# Patient Record
Sex: Female | Born: 1970
Health system: Southern US, Community
[De-identification: ages and names within clinical notes are randomized; demographics above are authoritative.]

## PROBLEM LIST (undated history)

## (undated) DIAGNOSIS — Z91018 Allergy to other foods: Secondary | ICD-10-CM

## (undated) DIAGNOSIS — E119 Type 2 diabetes mellitus without complications: Secondary | ICD-10-CM

## (undated) DIAGNOSIS — H409 Unspecified glaucoma: Secondary | ICD-10-CM

## (undated) DIAGNOSIS — J45909 Unspecified asthma, uncomplicated: Secondary | ICD-10-CM

## (undated) DIAGNOSIS — L309 Dermatitis, unspecified: Secondary | ICD-10-CM

## (undated) HISTORY — PX: TYMPANOSTOMY TUBE PLACEMENT: SHX32

## (undated) HISTORY — PX: TOE SURGERY: SHX1073

## (undated) HISTORY — DX: Unspecified glaucoma: H40.9

## (undated) HISTORY — DX: Dermatitis, unspecified: L30.9

## (undated) HISTORY — DX: Allergy to other foods: Z91.018

## (undated) HISTORY — PX: GLAUCOMA SURGERY: SHX656

## (undated) HISTORY — DX: Type 2 diabetes mellitus without complications: E11.9

---

## 2014-04-13 ENCOUNTER — Encounter (HOSPITAL_COMMUNITY): Payer: Self-pay | Admitting: Emergency Medicine

## 2014-04-13 ENCOUNTER — Emergency Department (HOSPITAL_COMMUNITY)
Admission: EM | Admit: 2014-04-13 | Discharge: 2014-04-13 | Disposition: A | Discharge status: Home / Self Care | Payer: BC Managed Care – PPO | Attending: Emergency Medicine | Admitting: Emergency Medicine

## 2014-04-13 DIAGNOSIS — Z79899 Other long term (current) drug therapy: Secondary | ICD-10-CM | POA: Diagnosis not present

## 2014-04-13 DIAGNOSIS — Y9389 Activity, other specified: Secondary | ICD-10-CM | POA: Insufficient documentation

## 2014-04-13 DIAGNOSIS — S199XXA Unspecified injury of neck, initial encounter: Secondary | ICD-10-CM

## 2014-04-13 DIAGNOSIS — J45909 Unspecified asthma, uncomplicated: Secondary | ICD-10-CM | POA: Diagnosis not present

## 2014-04-13 DIAGNOSIS — T148XXA Other injury of unspecified body region, initial encounter: Secondary | ICD-10-CM

## 2014-04-13 DIAGNOSIS — Y92410 Unspecified street and highway as the place of occurrence of the external cause: Secondary | ICD-10-CM | POA: Insufficient documentation

## 2014-04-13 DIAGNOSIS — S0993XA Unspecified injury of face, initial encounter: Secondary | ICD-10-CM | POA: Diagnosis not present

## 2014-04-13 DIAGNOSIS — IMO0002 Reserved for concepts with insufficient information to code with codable children: Secondary | ICD-10-CM | POA: Diagnosis not present

## 2014-04-13 HISTORY — DX: Unspecified asthma, uncomplicated: J45.909

## 2014-04-13 MED ORDER — CYCLOBENZAPRINE HCL 10 MG PO TABS: 10.0000 mg | ORAL_TABLET | Freq: Three times a day (TID) | ORAL | Status: DC | PRN
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
## 2014-04-13 NOTE — ED Provider Notes (Signed)
Medical screening examination/treatment/procedure(s) were performed by non-physician practitioner and as supervising physician I was immediately available for consultation/collaboration.   EKG Interpretation None       Doug SouSam Yousra Ivens, MD 04/13/14 2256
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
## 2014-11-08 ENCOUNTER — Ambulatory Visit: Payer: Self-pay | Admitting: Emergency Medicine

## 2015-02-07 ENCOUNTER — Ambulatory Visit (INDEPENDENT_AMBULATORY_CARE_PROVIDER_SITE_OTHER): Payer: BLUE CROSS/BLUE SHIELD | Admitting: Family Medicine

## 2015-02-07 VITALS — BP 130/94 | HR 93 | Temp 98.6°F | Resp 18 | Ht 69.0 in | Wt 306.2 lb

## 2015-02-07 DIAGNOSIS — J209 Acute bronchitis, unspecified: Secondary | ICD-10-CM

## 2015-02-07 MED ORDER — PREDNISONE 20 MG PO TABS: ORAL_TABLET | ORAL | Status: DC

## 2015-02-07 MED ORDER — AZITHROMYCIN 250 MG PO TABS: ORAL_TABLET | ORAL | Status: DC

## 2015-02-07 NOTE — Progress Notes (Signed)
Patient ID: Megan Lynn, female   DOB: 09-13-1970, 44 y.o.   MRN: [id number]   This chart was scribed for Elvina Sidle, MD by [facility], medical scribe at [facility].The patient was seen in exam room 12 and the patient's care was started at 6:28 PM.  Patient ID: Megan Lynn MRN: [id number], DOB: 1971/04/02, 44 y.o. Date of Encounter: 02/07/2015  Primary Physician: No primary care provider on file.  Chief Complaint:  Chief Complaint  Patient presents with   Cough    x yesterday   Shortness of Breath    some wheezing   HPI:  Megan Lynn is a 44 y.o. female who presents to [facility] complaining of cough, sore throat, headache for one week. Productive cough, shortness of breath and wheezing began yesterday. Sore throat has improved but chest congestion worsened. She is producing green, yellow phlegm. Pt has a history of asthma and she is taking Nucala, she does have a rescue inhaler. Stomach flu a week before this began so she still has some nausea but no vomiting or diarrhea. She denies fever. 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 She recently accepted a family from [place].  Past Medical History  Diagnosis Date   Asthma     Home Meds: Prior to Admission medications   Medication Sig Start Date End Date Taking? Authorizing Provider  albuterol (PROVENTIL HFA;VENTOLIN HFA) 108 (90 BASE) MCG/ACT inhaler Inhale into the lungs every 6 (six) hours as needed for wheezing or shortness of breath.   Yes Historical Provider, MD  cetirizine (ZYRTEC ALLERGY) 10 MG tablet Take 10 mg by mouth daily.   Yes Historical Provider, MD  fluticasone (FLONASE) 50 MCG/ACT nasal spray Place 1 spray into both nostrils daily as needed for allergies.    Yes Historical Provider, MD  hydrOXYzine (ATARAX/VISTARIL) 10 MG tablet Take 10 mg by mouth 3 (three) times daily as needed for itching.   Yes Historical Provider, MD  Mepolizumab (NUCALA [place]) Inject into  the skin.   Yes Historical Provider, MD    Allergies:  Allergies  Allergen Reactions   Sulfa Antibiotics Shortness Of Breath and Rash    History   Social History   Marital Status: Single    Spouse Name: N/A   Number of Children: N/A   Years of Education: N/A   Occupational History   Not on file.   Social History Main Topics   Smoking status: Never Smoker    Smokeless tobacco: Not on file   Alcohol Use: Yes     Comment: occ   Drug Use: No   Sexual Activity: Yes    Birth Control/ Protection: None   Other Topics Concern   Not on file   Social History Narrative    Review of Systems: Constitutional: negative for chills, fever, night sweats, weight changes, or fatigue  HEENT: negative for vision changes, hearing loss, rhinorrhea, epistaxis. Positive for sinus pressure, congestion, and sore throat. Cardiovascular: negative for chest pain or palpitations Respiratory: negative for hemoptysis. Positive for wheezing, shortness of breath, or cough Abdominal: negative for abdominal pain, vomiting, diarrhea, or constipation Positive for nausea. Dermatological: negative for rash Neurologic: negative for headache, dizziness, or syncope All other systems reviewed and are otherwise negative with the exception to those above and in the HPI.  Physical Exam: Blood pressure 130/94, pulse 93, temperature 98.6 F (37 C), temperature source Oral, resp. rate 18, height  (1.753 m), weight 306 lb  3.2 oz (138.891 kg), SpO2 97 %.Body mass index is 45.2 kg/(m^2). General: Well developed, well nourished, in no acute distress. Head: Normocephalic, atraumatic, eyes without discharge, sclera non-icteric, nares are without discharge. Bilateral auditory canals clear, TM's are without perforation, pearly grey and translucent with reflective cone of light bilaterally. Oral cavity moist, posterior pharynx without exudate, erythema, peritonsillar abscess, or post nasal drip.  Neck: Supple. No  thyromegaly. Full ROM. No lymphadenopathy. Lungs: Clear bilaterally to auscultation. Breathing is unlabored. Expiratory high pitch squeaks. No accessory muscles used. No tachypnea. Heart: RRR with S1 S2. No murmurs, rubs, or gallops appreciated. Abdomen: Soft, non-tender, non-distended with normoactive bowel sounds. No hepatomegaly. No rebound/guarding. No obvious abdominal masses. Msk:  Strength and tone normal for age. Extremities/Skin: Warm and dry. No clubbing or cyanosis. No edema. No rashes or suspicious lesions. Neuro: Alert and oriented X 3. Moves all extremities spontaneously. Gait is normal. CNII-XII grossly in tact. Psych:  Responds to questions appropriately with a normal affect.   Labs:  ASSESSMENT AND PLAN:  44 y.o. year old female with   This chart was scribed in my presence and reviewed by me personally.    ICD-9-CM ICD-10-CM   1. Acute bronchitis, unspecified organism 466.0 J20.9 predniSONE (DELTASONE) 20 MG tablet     azithromycin (ZITHROMAX) 250 MG tablet     Signed, Elvina Sidle, MD  Signed, Elvina Sidle, MD 02/07/2015 6:28 PM

## 2015-05-08 DIAGNOSIS — J4540 Moderate persistent asthma, uncomplicated: Secondary | ICD-10-CM

## 2015-05-08 DIAGNOSIS — J309 Allergic rhinitis, unspecified: Secondary | ICD-10-CM | POA: Insufficient documentation

## 2015-05-08 DIAGNOSIS — L309 Dermatitis, unspecified: Secondary | ICD-10-CM | POA: Insufficient documentation

## 2015-05-08 DIAGNOSIS — J4550 Severe persistent asthma, uncomplicated: Secondary | ICD-10-CM | POA: Insufficient documentation

## 2015-05-08 DIAGNOSIS — Z91018 Allergy to other foods: Secondary | ICD-10-CM | POA: Insufficient documentation

## 2015-05-09 ENCOUNTER — Other Ambulatory Visit: Payer: Self-pay | Admitting: *Deleted

## 2015-05-09 MED ORDER — MEPOLIZUMAB 100 MG ~~LOC~~ SOLR
100.0000 mg | SUBCUTANEOUS | Status: DC
  Administered 2015-06-09 – 2016-03-08 (×8): 100 mg via SUBCUTANEOUS

## 2015-06-09 ENCOUNTER — Ambulatory Visit (INDEPENDENT_AMBULATORY_CARE_PROVIDER_SITE_OTHER): Payer: BLUE CROSS/BLUE SHIELD | Admitting: Neurology

## 2015-06-09 DIAGNOSIS — J4550 Severe persistent asthma, uncomplicated: Secondary | ICD-10-CM

## 2015-06-09 DIAGNOSIS — J45901 Unspecified asthma with (acute) exacerbation: Secondary | ICD-10-CM

## 2015-06-16 ENCOUNTER — Other Ambulatory Visit: Payer: Self-pay | Admitting: *Deleted

## 2015-06-16 MED ORDER — ALBUTEROL SULFATE HFA 108 (90 BASE) MCG/ACT IN AERS: 2.0000 | INHALATION_SPRAY | RESPIRATORY_TRACT | Status: DC | PRN

## 2015-06-30 ENCOUNTER — Other Ambulatory Visit: Payer: Self-pay | Admitting: Allergy and Immunology

## 2015-06-30 ENCOUNTER — Telehealth: Payer: Self-pay

## 2015-06-30 MED ORDER — PREDNISONE 10 MG PO TABS: 40.0000 mg | ORAL_TABLET | Freq: Once | ORAL | Status: DC

## 2015-06-30 NOTE — Telephone Encounter (Signed)
Patient is notified of Prednisone RX sent to pharmacy and to see Dr. Lucie LeatherKozlow tomorrow 07/01/15 at 8:00 AM.  Patient voiced understood

## 2015-06-30 NOTE — Telephone Encounter (Signed)
Please have patient take 40mg  of prednisone now and see me in clinic at 800AM or can see Dr. Nunzio CobbsBobbitt today
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
## 2015-06-30 NOTE — Telephone Encounter (Signed)
PATIENT HAS HAD A RASH FOR A WEEK VERY ITCHY MAKING IT HARD TO SLEEP. SHE SAID HER SKIN FEELS LIKE AN ELEPHANT. HAS TRIED TRIAMCINOLONE, AND SOME ANTIBIOTICS.    PHARMACY IS [facility]  PATIENT OF DR. KOZLOW  PLEASE ADVISE THANKS

## 2015-06-30 NOTE — Telephone Encounter (Signed)
Tried to call patient.  No answer.  Left message for patient to call back to our office.

## 2015-07-01 ENCOUNTER — Ambulatory Visit (INDEPENDENT_AMBULATORY_CARE_PROVIDER_SITE_OTHER): Payer: BLUE CROSS/BLUE SHIELD | Admitting: Allergy and Immunology

## 2015-07-01 ENCOUNTER — Encounter: Payer: Self-pay | Admitting: Allergy and Immunology

## 2015-07-01 VITALS — BP 122/84 | HR 80 | Resp 20

## 2015-07-01 DIAGNOSIS — L209 Atopic dermatitis, unspecified: Secondary | ICD-10-CM | POA: Diagnosis not present

## 2015-07-01 DIAGNOSIS — J3089 Other allergic rhinitis: Secondary | ICD-10-CM | POA: Diagnosis not present

## 2015-07-01 DIAGNOSIS — Z91018 Allergy to other foods: Secondary | ICD-10-CM

## 2015-07-01 DIAGNOSIS — J4541 Moderate persistent asthma with (acute) exacerbation: Secondary | ICD-10-CM | POA: Diagnosis not present

## 2015-07-01 MED ORDER — FLUTICASONE FUROATE-VILANTEROL 200-25 MCG/INH IN AEPB: 1.0000 | INHALATION_SPRAY | Freq: Every day | RESPIRATORY_TRACT | Status: DC

## 2015-07-01 NOTE — Patient Instructions (Addendum)
  1. Prednisone 10mg  - four tablets today and tomorrow, then three tablets one time a day for 3 days, then two tablets one time per day for three days, then one tablet one time a day foe seven days  2. Continue topical therapy from Dr. Terri PiedraLupton  3. Check blood test results [place]  4. Breo 200 one inhalation one time per day. Add Qvar 80 two inhalations two times per day during 'flare up'  5. Continue flonase one or two sprays each nostril one time per day  6. Continue ProAir HFA, Epi-Pen, antihistamine if needed.  7. Continue NUCALA  8. Return at end of prednisone use or earlier if problem

## 2015-07-01 NOTE — Progress Notes (Signed)
[facility] [place] [place]  Follow-up Note  Refering Provider: No ref. provider found Primary Provider: Jerl MinaHEDRICK, JAMES, MD  Subjective:   Megan SellsSheila Dawnell Lynn is a 44 y.o. female who [facility] in re-evaluation of the following:  HPI Comments:  Megan HatchetSheila returns to this clinic noting that she's had problems with her asthma and her skin over the past weekend. She is developed wheezing and shortness of breath and had to use her bronchodilator multiple times this weekend. She is also developed a very red itchy dermatitis involving her arms and legs especially her feet and hands. She contacted me by telephone yesterday and we gave her 40 mg of prednisone and her rash is significantly improved as is her breathing. A lot of the swelling of her hands and redness of her hands has gone down as a result of taking prednisone. Concerning possible triggers, she has not had any new exposures, new medications, new supplements, and she is not had a fever or ugly mucus production. She did have nucala 3 weeks ago. However for the initial 2 weeks after nucala she had no problems.   Outpatient Encounter Prescriptions as of 07/01/2015  Medication Sig  . albuterol (PROAIR HFA) 108 (90 BASE) MCG/ACT inhaler Inhale 2 puffs into the lungs every 4 (four) hours as needed for wheezing or shortness of breath.  . cetirizine (ZYRTEC ALLERGY) 10 MG tablet Take 10 mg by mouth daily.  . diphenhydrAMINE (BENADRYL) 12.5 MG/5ML elixir Take by mouth as needed.  Marland Kitchen. EPINEPHrine (EPIPEN 2-PAK) 0.3 mg/0.3 mL IJ SOAJ injection Inject 0.3 mg into the muscle as needed (ALLERGIC REACTIONS).  . fluticasone (FLONASE) 50 MCG/ACT nasal spray 1 spray 2 (two) times daily.  . hydrOXYzine (ATARAX/VISTARIL) 25 MG tablet Take by mouth 2 (two) times daily.  . mometasone (ASMANEX 120 METERED DOSES) 220 MCG/INH inhaler Inhale into the lungs as needed. Prn  . [DISCONTINUED] albuterol  (PROVENTIL HFA;VENTOLIN HFA) 108 (90 BASE) MCG/ACT inhaler Inhale into the lungs every 6 (six) hours as needed for wheezing or shortness of breath.  . [DISCONTINUED] azithromycin (ZITHROMAX) 250 MG tablet 2-1-1-1-1 daily with food (Patient not taking: Reported on 06/30/2015)  . [DISCONTINUED] cetirizine (ZYRTEC) 10 MG tablet Take 1 tablet by mouth.  . [DISCONTINUED] fluticasone (FLONASE) 50 MCG/ACT nasal spray Place 1 spray into both nostrils daily as needed for allergies.   . [DISCONTINUED] hydrOXYzine (ATARAX/VISTARIL) 10 MG tablet Take 10 mg by mouth 2 (two) times daily.   . [DISCONTINUED] hydrOXYzine (ATARAX/VISTARIL) 10 MG tablet Take 1 tablet by mouth 2 (two) times daily.  . [DISCONTINUED] Mometasone Furoate (ASMANEX HFA) 200 MCG/ACT AERO Inhale 2 puffs into the lungs daily.  . [DISCONTINUED] predniSONE (DELTASONE) 10 MG tablet Take 4 tablets (40 mg total) by mouth once. Take 40 mg today at one time. (Patient not taking: Reported on 07/01/2015)  . [DISCONTINUED] predniSONE (DELTASONE) 20 MG tablet Two daily with food (Patient not taking: Reported on 06/30/2015)  . [DISCONTINUED] ranitidine (ZANTAC) 150 MG tablet Take by mouth daily.   Facility-Administered Encounter Medications as of 07/01/2015  Medication  . Mepolizumab SOLR 100 mg    No orders of the defined types were placed in this encounter.    Past Medical History  Diagnosis Date  . Asthma     Past Surgical History  Procedure Laterality Date  . Glaucoma surgery    . Tympanostomy tube placement      Allergies  Allergen Reactions  .  Erythromycin Shortness Of Breath and Rash  . Shellfish Allergy Shortness Of Breath and Rash  . Sulfa Antibiotics Shortness Of Breath, Rash and Anaphylaxis    Review of Systems  Constitutional: Negative for fever, chills and fatigue.  HENT: Negative for congestion, ear pain, facial swelling, nosebleeds, postnasal drip, rhinorrhea, sinus pressure, sneezing and sore throat.   Eyes: Negative  for pain, discharge, redness and itching.  Respiratory: Positive for cough and wheezing. Negative for choking, chest tightness, shortness of breath and stridor.   Cardiovascular: Negative for chest pain and leg swelling.  Gastrointestinal: Negative for nausea, vomiting and abdominal pain.  Musculoskeletal: Negative for myalgias and arthralgias.  Skin: Positive for rash.  Allergic/Immunologic: Negative.   Neurological: Negative for dizziness.  Hematological: Negative for adenopathy.     Objective:   Filed Vitals:   07/01/15 0831  BP: 122/84  Pulse: 80  Resp: 20          Physical Exam  Constitutional: She appears well-developed and well-nourished. No distress.  HENT:  Head: Normocephalic and atraumatic. Head is without right periorbital erythema and without left periorbital erythema.  Right Ear: Tympanic membrane, external ear and ear canal normal. No drainage or tenderness. No foreign bodies. Tympanic membrane is not injected, not scarred, not perforated, not erythematous, not retracted and not bulging. No middle ear effusion.  Left Ear: Tympanic membrane, external ear and ear canal normal. No drainage or tenderness. No foreign bodies. Tympanic membrane is not injected, not scarred, not perforated, not erythematous, not retracted and not bulging.  No middle ear effusion.  Nose: Nose normal. No mucosal edema, rhinorrhea, nose lacerations or sinus tenderness.  No foreign bodies.  Mouth/Throat: Oropharynx is clear and moist. No oropharyngeal exudate, posterior oropharyngeal edema, posterior oropharyngeal erythema or tonsillar abscesses.  Eyes: Lids are normal. Right eye exhibits no chemosis, no discharge and no exudate. No foreign body present in the right eye. Left eye exhibits no chemosis, no discharge and no exudate. No foreign body present in the left eye. Right conjunctiva is not injected. Left conjunctiva is not injected.  Neck: Neck supple. No tracheal tenderness present. No  tracheal deviation and no edema present. No thyroid mass and no thyromegaly present.  Cardiovascular: Normal rate, regular rhythm, S1 normal and S2 normal.  Exam reveals no gallop.   No murmur heard. Pulmonary/Chest: No accessory muscle usage or stridor. No respiratory distress. She has wheezes (end expiratorezing heard on forced expiration). She has no rhonchi. She has no rales.  Abdominal: Soft. There is no hepatosplenomegaly. There is no tenderness. There is no rigidity, no rebound and no guarding.  Lymphadenopathy:       Head (right side): No tonsillar adenopathy present.       Head (left side): No tonsillar adenopathy present.    She has no cervical adenopathy.  Neurological: She is alert.  Skin: Rash noted. She is not diaphoretic. There is erythema.  A diffuse macular papular eruption with significant erythema affecting the dorsal surfaces of her hands and forearms and legs. She had a small patch affecting her left chest.  Psychiatric: She has a normal mood and affect. Her behavior is normal.    Diagnostics:    Spirometry was performed and demonstrated an FEV1 of 2.40 at 77 % of predicted.  The patient had an Asthma Control Test with the following results:  .  Not completed  Assessment and Plan:   1. Other allergic rhinitis   2. Moderate persistent asthma, with acute exacerbation   3.  Food allergy   4. Atopic dermatitis      1. Prednisone  - four tablets today and tomorrow, then three tablets one time a day for 3 days, then two tablets one time per day for three days, then one tablet one time a day foe seven days  2. Continue topical therapy from Dr. Terri Piedra  3. Check blood test results from Dr. Raoul Pitch in [place]  4. Breo 200 one inhalation one time per day. Add Qvar 80 two inhalations two times per day during 'flare up'  5. Continue flonase one or two sprays each nostril one time per day  6. Continue ProAir HFA, Epi-Pen, antihistamine if needed.  7.  Continue NUCALA  8. Return at end of prednisone use or earlier if problem  Kendal has had a significant flare of her inflammatory dermatitis without an obvious trigger. Concurrent with this flare involving her skin has been involvement of her lower respiratory tract. I will give her systemic steroids as noted above and we will change around her medical therapy including consistent use of Brea with this point. We need to be cognizant of the fact that she was started on nucala last month and may be having a delayed reaction with inflammation of her skin as result of this exposure. Our assumption will be that she will resolve this dermatitis with therapy noted above and we will be able to give her another dose of nucala. If she flares again after her nucala administration then we will give her relatively high dose systemic steroid and and administration of this agent.   Laurette Schimke, MD [facility]

## 2015-07-07 ENCOUNTER — Ambulatory Visit (INDEPENDENT_AMBULATORY_CARE_PROVIDER_SITE_OTHER): Payer: BLUE CROSS/BLUE SHIELD

## 2015-07-07 DIAGNOSIS — J4550 Severe persistent asthma, uncomplicated: Secondary | ICD-10-CM | POA: Diagnosis not present

## 2015-07-07 DIAGNOSIS — J4540 Moderate persistent asthma, uncomplicated: Secondary | ICD-10-CM

## 2015-07-28 ENCOUNTER — Other Ambulatory Visit: Payer: Self-pay | Admitting: *Deleted

## 2015-07-28 MED ORDER — HYDROXYZINE HCL 25 MG PO TABS: 25.0000 mg | ORAL_TABLET | Freq: Two times a day (BID) | ORAL | Status: DC

## 2015-08-04 ENCOUNTER — Ambulatory Visit (INDEPENDENT_AMBULATORY_CARE_PROVIDER_SITE_OTHER): Payer: BLUE CROSS/BLUE SHIELD | Admitting: Neurology

## 2015-08-04 DIAGNOSIS — J4550 Severe persistent asthma, uncomplicated: Secondary | ICD-10-CM

## 2015-08-29 ENCOUNTER — Encounter: Payer: Self-pay | Admitting: Allergy and Immunology

## 2015-08-29 ENCOUNTER — Ambulatory Visit (INDEPENDENT_AMBULATORY_CARE_PROVIDER_SITE_OTHER): Payer: BLUE CROSS/BLUE SHIELD | Admitting: Allergy and Immunology

## 2015-08-29 VITALS — BP 130/90 | HR 89 | Temp 98.1°F | Resp 16

## 2015-08-29 DIAGNOSIS — J0100 Acute maxillary sinusitis, unspecified: Secondary | ICD-10-CM

## 2015-08-29 DIAGNOSIS — J45901 Unspecified asthma with (acute) exacerbation: Secondary | ICD-10-CM | POA: Diagnosis not present

## 2015-08-29 DIAGNOSIS — J3089 Other allergic rhinitis: Secondary | ICD-10-CM

## 2015-08-29 DIAGNOSIS — L309 Dermatitis, unspecified: Secondary | ICD-10-CM

## 2015-08-29 DIAGNOSIS — Z91018 Allergy to other foods: Secondary | ICD-10-CM | POA: Diagnosis not present

## 2015-08-29 DIAGNOSIS — B9689 Other specified bacterial agents as the cause of diseases classified elsewhere: Secondary | ICD-10-CM | POA: Insufficient documentation

## 2015-08-29 DIAGNOSIS — J019 Acute sinusitis, unspecified: Secondary | ICD-10-CM | POA: Insufficient documentation

## 2015-08-29 MED ORDER — IPRATROPIUM BROMIDE 0.02 % IN SOLN
0.5000 mg | Freq: Once | RESPIRATORY_TRACT | Status: AC
  Administered 2015-08-29: 0.5 mg via RESPIRATORY_TRACT

## 2015-08-29 MED ORDER — PREDNISONE 1 MG PO TABS: 10.0000 mg | ORAL_TABLET | ORAL | Status: DC

## 2015-08-29 MED ORDER — LEVALBUTEROL HCL 1.25 MG/3ML IN NEBU
1.2500 mg | INHALATION_SOLUTION | Freq: Once | RESPIRATORY_TRACT | Status: AC
  Administered 2015-08-29: 1.25 mg via RESPIRATORY_TRACT

## 2015-08-29 MED ORDER — AMOXICILLIN-POT CLAVULANATE 875-125 MG PO TABS: ORAL_TABLET | ORAL | Status: DC

## 2015-08-29 NOTE — Assessment & Plan Note (Signed)
   Continue allergen avoidance measures, cetirizine as needed, and fluticasone nasal spray as needed.

## 2015-08-29 NOTE — Assessment & Plan Note (Addendum)
   Prednisone has been provided, 40 mg x3 days, 20 mg x1 day, 10 mg x1 day, then stop.  Continue mepolizomab injections every month, Breo Ellipta 200/25 g, one inhalation daily, and albuterol every 4-6 hours as needed.  The patient has been asked to contact me if her symptoms persist or progress. Otherwise, she may return for follow up in 4 months.

## 2015-08-29 NOTE — Assessment & Plan Note (Addendum)
   Prednisone has been provided (as above).  A prescription has been provided for Augmentin 875/125 mg by mouth twice a day 10 days.  Continue fluticasone nasal spray.  Nasal saline lavage (NeilMed) as needed has been recommended along with instructions for proper administration.

## 2015-08-29 NOTE — Assessment & Plan Note (Signed)
   Continue treatment plan as per rectum by dermatologist, including triamcinolone 0.1% cream to affected areas twice a day.

## 2015-08-29 NOTE — Progress Notes (Signed)
[facility]  Follow-up Note  RE: Megan Lynn MRN: [id number] DOB: May 27, 1971 Date of Office Visit: 08/29/2015  Primary care provider: [name], MD Referring provider: [name], MD  History of present illness: HPI Comments: Megan Lynn is a 44 y.o. female severe persistent asthma on mepolizomab, allergic rhinitis, food allergy , and  eczema who presents today for sick visit.  She reports that over the past week she has experienced increased coughing, dyspnea , and wheezing , stating "I could barely breathe." She also complains of sinus pressure/pain, ear pressure, postnasal drainage, nasal congestion, fevers, and discolored mucus production.  She is followed by her dermatologist for atopic dermatitis.  She has a history of shellfish allergy.  She avoids shellfish and has access to epinephrine auto-injectors.   Assessment and plan: Asthma with acute exacerbation  Prednisone has been provided, 40 mg x3 days, 20 mg x1 day, 10 mg x1 day, then stop.  Continue mepolizomab injections every month, Breo Ellipta 200/25 g, one inhalation daily, and albuterol every 4-6 hours as needed.  The patient has been asked to contact me if her symptoms persist or progress. Otherwise, she may return for follow up in 4 months.  Acute sinusitis  Prednisone has been provided (as above).  A prescription has been provided for Augmentin 875/125 mg by mouth twice a day 10 days.  Continue fluticasone nasal spray.  Nasal saline lavage (NeilMed) as needed has been recommended along with instructions for proper administration.  Dermatitis  Continue treatment plan as per rectum by dermatologist, including triamcinolone 0.1% cream to affected areas twice a day.  Allergic rhinitis  Continue allergen avoidance measures, cetirizine as needed, and fluticasone nasal spray as needed.  Food allergy  Continue meticulous avoidance  of shellfish and have access to epinephrine autoinjector 2 pack in case of accidental ingestion.    Meds ordered this encounter  Medications  . levalbuterol (XOPENEX) nebulizer solution 1.25 mg    Sig:   . ipratropium (ATROVENT) nebulizer solution 0.5 mg    Sig:   . amoxicillin-clavulanate (AUGMENTIN) 875-125 MG tablet    Sig: Take 1 tablet by mouth twice a day for 10 days.    Dispense:  20 tablet    Refill:  0  . predniSONE (DELTASONE) tablet 10 mg    Sig:     Diagnositics: Spirometry reveals an FVC of 1.94 L and an FEV1 of 1.55 L (45% predicted) with significant (520 mL, 34%) postbronchodilator improvement.    Physical examination: Blood pressure 130/90, pulse 89, temperature 98.1 F (36.7 C), temperature source Oral, resp. rate 16.  General: Alert, interactive, in no acute distress. HEENT: TMs pearly gray, turbinates edematous with thick discharge, post-pharynx erythematous. Neck: Supple without lymphadenopathy. Lungs: Decreased breath sounds with expiratory wheezing bilaterally. CV: Normal S1, S2 without murmurs. Skin: Dry, hyperpigmented, thickened patches on the posterior neck.  The following portions of the patient's history were reviewed and updated as appropriate: allergies, current medications, past family history, past medical history, past social history, past surgical history and problem list.  Outpatient medications:   Medication List       This list is accurate as of: 08/29/15  1:33 PM.  Always use your most recent med list.               albuterol 108 (90 Base) MCG/ACT inhaler  Commonly known as:  PROAIR HFA  Inhale 2 puffs into the lungs every 4 (four) hours  as needed for wheezing or shortness of breath.     amoxicillin-clavulanate 875-125 MG tablet  Commonly known as:  AUGMENTIN  Take 1 tablet by mouth twice a day for 10 days.     ASMANEX 120 METERED DOSES 220 MCG/INH inhaler  Generic drug:  mometasone  Inhale into the lungs as needed.  Reported on 08/29/2015     cyclobenzaprine 10 MG tablet  Commonly known as:  FLEXERIL     diphenhydrAMINE 12.5 MG/5ML elixir  Commonly known as:  BENADRYL  Take by mouth as needed.     EPIPEN 2-PAK 0.3 mg/0.3 mL Soaj injection  Generic drug:  EPINEPHrine  Inject 0.3 mg into the muscle as needed (ALLERGIC REACTIONS).     fluticasone 50 MCG/ACT nasal spray  Commonly known as:  FLONASE  1 spray 2 (two) times daily.     Fluticasone Furoate-Vilanterol 200-25 MCG/INH Aepb  Commonly known as:  BREO ELLIPTA  Inhale 1 Dose into the lungs daily.     hydrOXYzine 25 MG tablet  Commonly known as:  ATARAX/VISTARIL  Take 1 tablet (25 mg total) by mouth 2 (two) times daily.     triamcinolone cream 0.1 %  Commonly known as:  KENALOG     ZYRTEC ALLERGY 10 MG tablet  Generic drug:  cetirizine  Take 10 mg by mouth daily.        Known medication allergies: Allergies  Allergen Reactions  . Erythromycin Shortness Of Breath and Rash  . Shellfish Allergy Shortness Of Breath and Rash  . Sulfa Antibiotics Shortness Of Breath, Rash and Anaphylaxis   Review of systems: Constitutional: Positive for fevers.  HENT: Negative for nosebleeds.  Positive for sinus pressure, postnasal drainage, nasal congestion.   Eyes: Negative for blurred vision.  Respiratory: Negative for hemoptysis.   Positive for dyspnea, coughing, wheezing. Cardiovascular: Negative for chest pain.  Gastrointestinal: Negative for diarrhea and constipation.  Genitourinary: Negative for dysuria.  Musculoskeletal: Negative for myalgias and joint pain.  Neurological: Negative for dizziness.  Endo/Heme/Allergies: Does not bruise/bleed easily.   Past Medical History  Diagnosis Date  . Asthma     Family History  Problem Relation Age of Onset  . Diabetes Mother     Social History   Social History  . Marital Status: Single    Spouse Name: N/A  . Number of Children: N/A  . Years of Education: N/A   Occupational History   . Not on file.   Social History Main Topics  . Smoking status: Never Smoker   . Smokeless tobacco: Not on file  . Alcohol Use: Yes     Comment: occ  . Drug Use: No  . Sexual Activity: Yes    Birth Control/ Protection: None   Other Topics Concern  . Not on file   Social History Narrative    I appreciate the opportunity to take part in this Jake's care. Please do not hesitate to contact me with questions.  Sincerely,   R. Jorene Guestarter Ambre Kobayashi, MD

## 2015-08-29 NOTE — Patient Instructions (Signed)
Asthma with acute exacerbation  Prednisone has been provided, 40 mg x3 days, 20 mg x1 day, 10 mg x1 day, then stop.  Continue mepolizomab injections every month, Breo Ellipta 200/25 g, one inhalation daily, and albuterol every 4-6 hours as needed.  The patient has been asked to contact me if her symptoms persist or progress. Otherwise, she may return for follow up in 4 months.  Acute sinusitis  Prednisone has been provided (as above).  A prescription has been provided for Augmentin 875/125 mg by mouth twice a day 10 days.  Continue fluticasone nasal spray.  Nasal saline lavage (NeilMed) as needed has been recommended along with instructions for proper administration.  Dermatitis  Continue treatment plan as per rectum by dermatologist, including triamcinolone 0.1% cream to affected areas twice a day.  Allergic rhinitis  Continue allergen avoidance measures, cetirizine as needed, and fluticasone nasal spray as needed.  Food allergy  Continue meticulous avoidance of shellfish and have access to epinephrine autoinjector 2 pack in case of accidental ingestion.    Return in about 4 months (around 12/28/2015), or if symptoms worsen or fail to improve.

## 2015-08-29 NOTE — Assessment & Plan Note (Signed)
   Continue meticulous avoidance of shellfish and have access to epinephrine autoinjector 2 pack in case of accidental ingestion. 

## 2015-09-05 ENCOUNTER — Ambulatory Visit (INDEPENDENT_AMBULATORY_CARE_PROVIDER_SITE_OTHER): Payer: BLUE CROSS/BLUE SHIELD | Admitting: Neurology

## 2015-09-05 ENCOUNTER — Encounter: Payer: BLUE CROSS/BLUE SHIELD | Admitting: *Deleted

## 2015-09-05 ENCOUNTER — Telehealth: Payer: Self-pay

## 2015-09-05 DIAGNOSIS — J4550 Severe persistent asthma, uncomplicated: Secondary | ICD-10-CM

## 2015-09-05 MED ORDER — FLUCONAZOLE 150 MG PO TABS: ORAL_TABLET | ORAL | Status: DC

## 2015-09-05 NOTE — Telephone Encounter (Signed)
Diflucan 150mg  Disp:#1 no refills  Take one tablet by mouth today.

## 2015-09-05 NOTE — Telephone Encounter (Signed)
Medication sent and patient notified  

## 2015-09-05 NOTE — Telephone Encounter (Signed)
Spoke with patient and notified that Dr Nunzio CobbsBobbitt wouldn't be back till Monday. Patient is asking if Dr Willa RoughHicks could send in Diflucan to her pharmacy to help with yeast infection. Reviewed medication and drug allergies with patient and advised that we would call her back once we heard from Dr Willa RoughHicks.

## 2015-09-05 NOTE — Telephone Encounter (Signed)
Saw Dr. Nunzio CobbsBobbitt last week who prescribed Augmentin.  Patient reports yeast infection and would like someone to call Diflucan to pharmacy.  Please advise.

## 2015-09-05 NOTE — Addendum Note (Signed)
Addended by: Nestor LewandowskyOLEMAN, Mairim Bade E on: 09/05/2015 10:54 AM   Modules accepted: Orders

## 2015-09-05 NOTE — Progress Notes (Signed)
This encounter was created in error - please disregard.

## 2015-09-10 ENCOUNTER — Encounter: Payer: Self-pay | Admitting: Allergy and Immunology

## 2015-09-10 ENCOUNTER — Ambulatory Visit (INDEPENDENT_AMBULATORY_CARE_PROVIDER_SITE_OTHER): Payer: BLUE CROSS/BLUE SHIELD | Admitting: Allergy and Immunology

## 2015-09-10 VITALS — BP 140/80 | HR 100 | Temp 98.5°F | Resp 20

## 2015-09-10 DIAGNOSIS — Z91018 Allergy to other foods: Secondary | ICD-10-CM

## 2015-09-10 DIAGNOSIS — J45901 Unspecified asthma with (acute) exacerbation: Secondary | ICD-10-CM | POA: Diagnosis not present

## 2015-09-10 DIAGNOSIS — J0101 Acute recurrent maxillary sinusitis: Secondary | ICD-10-CM | POA: Diagnosis not present

## 2015-09-10 DIAGNOSIS — L309 Dermatitis, unspecified: Secondary | ICD-10-CM | POA: Diagnosis not present

## 2015-09-10 MED ORDER — MOMETASONE FUROATE 220 MCG/INH IN AEPB: 2.0000 | INHALATION_SPRAY | Freq: Two times a day (BID) | RESPIRATORY_TRACT | Status: DC

## 2015-09-10 MED ORDER — TIOTROPIUM BROMIDE MONOHYDRATE 1.25 MCG/ACT IN AERS: 2.0000 | INHALATION_SPRAY | Freq: Every day | RESPIRATORY_TRACT | Status: DC

## 2015-09-10 MED ORDER — FLUCONAZOLE 150 MG PO TABS: ORAL_TABLET | ORAL | Status: DC

## 2015-09-10 MED ORDER — LEVALBUTEROL HCL 1.25 MG/3ML IN NEBU
1.2500 mg | INHALATION_SOLUTION | Freq: Once | RESPIRATORY_TRACT | Status: AC
  Administered 2015-09-10: 1.25 mg via RESPIRATORY_TRACT

## 2015-09-10 MED ORDER — TIOTROPIUM BROMIDE MONOHYDRATE 1.25 MCG/ACT IN AERS: 1.0000 | INHALATION_SPRAY | Freq: Two times a day (BID) | RESPIRATORY_TRACT | Status: DC

## 2015-09-10 MED ORDER — IPRATROPIUM BROMIDE 0.02 % IN SOLN
0.5000 mg | Freq: Once | RESPIRATORY_TRACT | Status: AC
Start: 2015-09-10 — End: 2015-09-10
  Administered 2015-09-10: 0.5 mg via RESPIRATORY_TRACT

## 2015-09-10 MED ORDER — ALBUTEROL SULFATE HFA 108 (90 BASE) MCG/ACT IN AERS: 2.0000 | INHALATION_SPRAY | RESPIRATORY_TRACT | Status: DC | PRN

## 2015-09-10 MED ORDER — METHYLPREDNISOLONE ACETATE 80 MG/ML IJ SUSP
80.0000 mg | Freq: Once | INTRAMUSCULAR | Status: AC
Start: 2015-09-10 — End: 2015-09-10
  Administered 2015-09-10: 80 mg via INTRAMUSCULAR

## 2015-09-10 MED ORDER — DOXYCYCLINE HYCLATE 200 MG PO TBEC: DELAYED_RELEASE_TABLET | ORAL | Status: DC

## 2015-09-10 NOTE — Assessment & Plan Note (Signed)
   Depo-Medrol and prednisone have been provided (as above).  A prescription has been provided for Spiriva 1.25 g, 2 inhalations daily.  Instructions for proper administration have been provided.  For now, continue mepolizomab injections monthly, Asmanex 220 g, 2 inhalations twice a day during flares, and albuterol every 4-6 hours as needed.  The patient has been asked to contact me if her symptoms persist or progress. Otherwise, she may return for follow up in 6 weeks.

## 2015-09-10 NOTE — Assessment & Plan Note (Addendum)
   Depo-Medrol 80 mg was administered in the office.  Prednisone has been provided and is to be started tomorrow as follows: 20 mg daily x 4 days, 10 mg x1 day, then stop.  A prescription has been provided for doxycycline 200 mg daily 10 days.  Due to her history of antibiotic associated vulvovaginal candidiasis, a prescription has been provided for Diflucan 150 mg, 1 tablet per mouth and then a second tablet to be taken 72 hours later.  Continue fluticasone nasal spray and nasal saline lavage.

## 2015-09-10 NOTE — Assessment & Plan Note (Signed)
   Continue meticulous avoidance of shellfish and have access to epinephrine autoinjector 2 pack in case of accidental ingestion. 

## 2015-09-10 NOTE — Progress Notes (Signed)
Follow-up Note  RE: Megan Lynn MRN: [id number] DOB: 06-19-1971 Date of Office Visit: 09/10/2015  Primary care provider: Jerl MinaHEDRICK, JAMES, MD Referring provider: Jerl MinaHedrick, James, MD  History of present illness: HPI Comments: Megan Lynn is a 45 y.o. female with severe persistent asthma on mepolizomab, allergic rhinitis, food allergy , andeczema who presents today for a sick visit. She was recently treated for acute sinusitis and an asthma exacerbation with a prednisone burst/taper and a course of Augmentin.  She reports that her symptoms had improved, however over the past week she again began to experience sinus pressure/pain, thick postnasal drainage, nasal congestion, and fatigue as well as a fever, chills, and yesterday.  She has also experienced increased coughing, dyspnea, and wheezing over this past week.  She is unable to take long-acting beta agonist due to "terrible heart palpitations."  She currently receives mepolizomab injections monthly and takes Asmanex 220 g, 2 inhalations twice a day, with asthma flares. She is followed by her dermatologist for atopic dermatitis. She has a history of shellfish allergy. She avoids shellfish and has access to epinephrine auto-injectors.   Assessment and plan: Acute sinusitis  Depo-Medrol 80 mg was administered in the office.  Prednisone has been provided and is to be started tomorrow as follows: 20 mg daily x 4 days, 10 mg x1 day, then stop.  A prescription has been provided for doxycycline 200 mg daily 10 days.  Continue fluticasone nasal spray and nasal saline lavage.  Asthma with acute exacerbation  Depo-Medrol and prednisone have been provided (as above).  A prescription has been provided for Spiriva 1.25 g, 2 inhalations daily.  Instructions for proper administration have been provided.  For now, continue mepolizomab injections monthly, Asmanex 220 g, 2 inhalations twice a day during flares, and albuterol  every 4-6 hours as needed.  The patient has been asked to contact me if her symptoms persist or progress. Otherwise, she may return for follow up in 6 weeks.  Food allergy  Continue meticulous avoidance of shellfish and have access to epinephrine autoinjector 2 pack in case of accidental ingestion.  Dermatitis  Continue treatment plan as recommended by dermatologist, including triamcinolone 0.1% cream to affected areas twice a day as needed.    Meds ordered this encounter  Medications  . ipratropium (ATROVENT) nebulizer solution 0.5 mg    Sig:   . levalbuterol (XOPENEX) nebulizer solution 1.25 mg    Sig:   . methylPREDNISolone acetate (DEPO-MEDROL) injection 80 mg    Sig:   . Doxycycline Hyclate 200 MG TBEC    Sig: Take one tablet daily for 10 days    Dispense:  10 tablet    Refill:  0  . fluconazole (DIFLUCAN) 150 MG tablet    Sig: Take one tablet on day one and then the other 72 hours later.    Dispense:  2 tablet    Refill:  1  . mometasone (ASMANEX 120 METERED DOSES) 220 MCG/INH inhaler    Sig: Inhale 2 puffs into the lungs 2 (two) times daily. Reported on 09/05/2015    Dispense:  1 Inhaler    Refill:  3  . DISCONTD: Tiotropium Bromide Monohydrate (SPIRIVA RESPIMAT) 1.25 MCG/ACT AERS    Sig: Inhale 1 puff into the lungs 2 (two) times daily.    Dispense:  1 Inhaler    Refill:  3  . albuterol (PROAIR HFA) 108 (90 Base) MCG/ACT inhaler    Sig: Inhale 2 puffs into the lungs every 4 (  four) hours as needed for wheezing or shortness of breath.    Dispense:  1 Inhaler    Refill:  1  . Tiotropium Bromide Monohydrate (SPIRIVA RESPIMAT) 1.25 MCG/ACT AERS    Sig: Inhale 2 puffs into the lungs daily.    Dispense:  1 Inhaler    Refill:  3    Diagnositics: Spirometry today reveals an FVC of 2.22 L and an FEV1 of 1.88 L (60% predicted) without significant post bronchodilator improvement.  Please see scanned spirometry results for details.    Physical examination: Blood pressure  140/80, pulse 100, temperature 98.5 F (36.9 C), resp. rate 20, SpO2 95 %.  General: Alert, interactive, in no acute distress. HEENT: TMs pearly gray, turbinates edematous and erythematous with thick discharge, post-pharynx markedly erythematous. Neck: Supple without lymphadenopathy. Lungs: Decreased breath sounds with expiratory wheezing bilaterally. CV: Normal S1, S2 without murmurs. Skin: Warm and dry, without lesions or rashes.  The following portions of the patient's history were reviewed and updated as appropriate: allergies, current medications, past family history, past medical history, past social history, past surgical history and problem list.    Medication List       This list is accurate as of: 09/10/15 12:17 PM.  Always use your most recent med list.               albuterol 108 (90 Base) MCG/ACT inhaler  Commonly known as:  PROAIR HFA  Inhale 2 puffs into the lungs every 4 (four) hours as needed for wheezing or shortness of breath.     amoxicillin-clavulanate 875-125 MG tablet  Commonly known as:  AUGMENTIN  Take 1 tablet by mouth twice a day for 10 days.     beclomethasone 80 MCG/ACT inhaler  Commonly known as:  QVAR  Inhale 2 puffs into the lungs 2 (two) times daily.     cyclobenzaprine 10 MG tablet  Commonly known as:  FLEXERIL  Reported on 09/10/2015     diphenhydrAMINE 12.5 MG/5ML elixir  Commonly known as:  BENADRYL  Take by mouth as needed.     Doxycycline Hyclate 200 MG Tbec  Take one tablet daily for 10 days     EPIPEN 2-PAK 0.3 mg/0.3 mL Soaj injection  Generic drug:  EPINEPHrine  Inject 0.3 mg into the muscle as needed (ALLERGIC REACTIONS).     fluconazole 150 MG tablet  Commonly known as:  DIFLUCAN  Take one tablet on day one and then the other 72 hours later.     fluticasone 50 MCG/ACT nasal spray  Commonly known as:  FLONASE  1 spray 2 (two) times daily.     Fluticasone Furoate-Vilanterol 200-25 MCG/INH Aepb  Commonly known as:  BREO  ELLIPTA  Inhale 1 Dose into the lungs daily.     hydrOXYzine 25 MG tablet  Commonly known as:  ATARAX/VISTARIL  Take 1 tablet (25 mg total) by mouth 2 (two) times daily.     mometasone 220 MCG/INH inhaler  Commonly known as:  ASMANEX 120 METERED DOSES  Inhale 2 puffs into the lungs 2 (two) times daily. Reported on 09/05/2015     Tiotropium Bromide Monohydrate 1.25 MCG/ACT Aers  Commonly known as:  SPIRIVA RESPIMAT  Inhale 2 puffs into the lungs daily.     triamcinolone cream 0.1 %  Commonly known as:  KENALOG  Reported on 09/05/2015     ZYRTEC ALLERGY 10 MG tablet  Generic drug:  cetirizine  Take 10 mg by mouth daily.  Allergies  Allergen Reactions  . Erythromycin Shortness Of Breath and Rash  . Shellfish Allergy Shortness Of Breath and Rash  . Sulfa Antibiotics Shortness Of Breath, Rash and Anaphylaxis   Review of systems: Constitutional: Positive for fevers and chills.  HENT: Negative for nosebleeds.   Positive for sinus pressure, nasal congestion, and postnasal drainage. Eyes: Negative for blurred vision.  Respiratory: Negative for hemoptysis.   Positive for coughing, dyspnea, and wheezing. Cardiovascular: Negative for chest pain.  Gastrointestinal: Negative for diarrhea and constipation.  Genitourinary: Negative for dysuria.  Musculoskeletal: Negative for myalgias and joint pain.  Neurological: Negative for dizziness.  Endo/Heme/Allergies: Does not bruise/bleed easily.   Past Medical History  Diagnosis Date  . Asthma     Family History  Problem Relation Age of Onset  . Diabetes Mother     Social History   Social History  . Marital Status: Single    Spouse Name: N/A  . Number of Children: N/A  . Years of Education: N/A   Occupational History  . Not on file.   Social History Main Topics  . Smoking status: Never Smoker   . Smokeless tobacco: Not on file  . Alcohol Use: Yes     Comment: occ  . Drug Use: No  . Sexual Activity: Yes    Birth  Control/ Protection: None   Other Topics Concern  . Not on file   Social History Narrative    I appreciate the opportunity to take part in this Raelynn's care. Please do not hesitate to contact me with questions.  Sincerely,   R. Jorene Guest, MD

## 2015-09-10 NOTE — Assessment & Plan Note (Signed)
   Continue treatment plan as recommended by dermatologist, including triamcinolone 0.1% cream to affected areas twice a day as needed.

## 2015-09-10 NOTE — Patient Instructions (Addendum)
Acute sinusitis  Depo-Medrol 80 mg was administered in the office.  Prednisone has been provided and is to be started tomorrow as follows: 20 mg daily x 4 days, 10 mg x1 day, then stop.  A prescription has been provided for doxycycline 200 mg daily 10 days.  Continue fluticasone nasal spray and nasal saline lavage.  Asthma with acute exacerbation  Depo-Medrol and prednisone have been provided (as above).  A prescription has been provided for Spiriva 1.25 g, 2 inhalations daily.  Instructions for proper administration have been provided.  For now, continue mepolizomab injections monthly, Asmanex 220 g, 2 inhalations twice a day during flares, and albuterol every 4-6 hours as needed.  The patient has been asked to contact me if her symptoms persist or progress. Otherwise, she may return for follow up in 6 weeks.  Food allergy  Continue meticulous avoidance of shellfish and have access to epinephrine autoinjector 2 pack in case of accidental ingestion.  Dermatitis  Continue treatment plan as recommended by dermatologist, including triamcinolone 0.1% cream to affected areas twice a day as needed.    Return in about 6 weeks (around 10/22/2015), or if symptoms worsen or fail to improve.

## 2015-09-11 ENCOUNTER — Other Ambulatory Visit: Payer: Self-pay | Admitting: Allergy and Immunology

## 2015-09-17 ENCOUNTER — Telehealth: Payer: Self-pay

## 2015-09-17 MED ORDER — BENZONATATE 100 MG PO CAPS
ORAL_CAPSULE | ORAL | Status: DC
Start: 2015-09-17 — End: 2016-01-14

## 2015-09-17 NOTE — Telephone Encounter (Signed)
RX sent in and patient notified

## 2015-09-17 NOTE — Telephone Encounter (Signed)
Patient was seen on 09/10/2015 & 08/29/2015. Patient is still coughing especially at night. She has tried everything over the counter she can think of.   Please Advise  [facility]

## 2015-09-17 NOTE — Telephone Encounter (Signed)
Please call in Benzonatate 100-200 mg by mouth every 8 hours as needed.  Thanks.

## 2015-09-17 NOTE — Telephone Encounter (Signed)
Spoke with patient who said's she is getting some better but she is still coughing during the middle of the night. Patient is asking is she could have anything to help with the coughing at night?

## 2015-09-18 ENCOUNTER — Other Ambulatory Visit: Payer: Self-pay | Admitting: Neurology

## 2015-09-18 MED ORDER — BENZONATATE 100 MG PO CAPS: ORAL_CAPSULE | ORAL | Status: DC

## 2015-09-19 ENCOUNTER — Other Ambulatory Visit: Payer: Self-pay | Admitting: Neurology

## 2015-09-24 ENCOUNTER — Ambulatory Visit: Payer: BLUE CROSS/BLUE SHIELD | Admitting: Allergy and Immunology

## 2015-09-30 ENCOUNTER — Other Ambulatory Visit: Payer: Self-pay | Admitting: Adult Health

## 2015-09-30 DIAGNOSIS — N6320 Unspecified lump in the left breast, unspecified quadrant: Principal | ICD-10-CM

## 2015-09-30 DIAGNOSIS — N6325 Unspecified lump in the left breast, overlapping quadrants: Secondary | ICD-10-CM

## 2015-09-30 DIAGNOSIS — N644 Mastodynia: Secondary | ICD-10-CM

## 2015-10-03 ENCOUNTER — Other Ambulatory Visit: Payer: Self-pay | Admitting: Adult Health

## 2015-10-03 ENCOUNTER — Ambulatory Visit
Admission: RE | Admit: 2015-10-03 | Discharge: 2015-10-03 | Disposition: A | Discharge status: Home / Self Care | Payer: BLUE CROSS/BLUE SHIELD | Source: Ambulatory Visit | Attending: Adult Health | Admitting: Adult Health

## 2015-10-03 ENCOUNTER — Other Ambulatory Visit: Payer: Self-pay

## 2015-10-03 DIAGNOSIS — N644 Mastodynia: Secondary | ICD-10-CM

## 2015-10-03 DIAGNOSIS — N6320 Unspecified lump in the left breast, unspecified quadrant: Principal | ICD-10-CM

## 2015-10-03 DIAGNOSIS — N6325 Unspecified lump in the left breast, overlapping quadrants: Secondary | ICD-10-CM

## 2015-10-17 ENCOUNTER — Ambulatory Visit (INDEPENDENT_AMBULATORY_CARE_PROVIDER_SITE_OTHER): Payer: BLUE CROSS/BLUE SHIELD | Admitting: Neurology

## 2015-10-17 DIAGNOSIS — J4550 Severe persistent asthma, uncomplicated: Secondary | ICD-10-CM | POA: Diagnosis not present

## 2015-10-27 ENCOUNTER — Other Ambulatory Visit: Payer: Self-pay

## 2015-10-27 MED ORDER — HYDROXYZINE HCL 25 MG PO TABS: 25.0000 mg | ORAL_TABLET | Freq: Two times a day (BID) | ORAL | Status: DC

## 2015-10-27 NOTE — Telephone Encounter (Signed)
Refill Rx Hydroxyzine with one refill

## 2015-11-17 ENCOUNTER — Ambulatory Visit (INDEPENDENT_AMBULATORY_CARE_PROVIDER_SITE_OTHER): Payer: BLUE CROSS/BLUE SHIELD

## 2015-11-17 DIAGNOSIS — J4550 Severe persistent asthma, uncomplicated: Secondary | ICD-10-CM

## 2015-12-15 ENCOUNTER — Ambulatory Visit (INDEPENDENT_AMBULATORY_CARE_PROVIDER_SITE_OTHER): Payer: BLUE CROSS/BLUE SHIELD

## 2015-12-15 DIAGNOSIS — J4550 Severe persistent asthma, uncomplicated: Secondary | ICD-10-CM | POA: Diagnosis not present

## 2015-12-30 ENCOUNTER — Other Ambulatory Visit: Payer: Self-pay

## 2015-12-30 MED ORDER — HYDROXYZINE HCL 25 MG PO TABS: 25.0000 mg | ORAL_TABLET | Freq: Two times a day (BID) | ORAL | Status: DC

## 2015-12-30 NOTE — Telephone Encounter (Signed)
Patient called for refill. [place] in [place]. I have [facility]

## 2016-01-02 ENCOUNTER — Telehealth: Payer: Self-pay | Admitting: Allergy and Immunology

## 2016-01-02 MED ORDER — PREDNISONE 10 MG PO TABS: ORAL_TABLET | ORAL | Status: DC

## 2016-01-02 NOTE — Telephone Encounter (Signed)
Spoke with patient instructions given as written below. Patient verbalized understanding [facility] was [facility].

## 2016-01-02 NOTE — Telephone Encounter (Signed)
Megan HatchetSheila is on her honeymoon and is having a asthma flare and skin is breaking out. She wants something called in since she is out of town. She can come in next week when she gets back for a check up. She thinks its stress since her mom recently passed away while she is on honeymoon.   [facility] [address] [place]

## 2016-01-02 NOTE — Telephone Encounter (Signed)
Please provide patient with prednisone 10mg  ine tablet two times a day for three days then one tablet one time per day for three days. Check blood sugars. Also, have her [employer] Dupilumab which we will switch her to better control her allergic disease when she returns to clinic.

## 2016-01-12 ENCOUNTER — Ambulatory Visit (INDEPENDENT_AMBULATORY_CARE_PROVIDER_SITE_OTHER): Payer: BLUE CROSS/BLUE SHIELD | Admitting: *Deleted

## 2016-01-12 DIAGNOSIS — J4550 Severe persistent asthma, uncomplicated: Secondary | ICD-10-CM | POA: Diagnosis not present

## 2016-01-14 ENCOUNTER — Ambulatory Visit (INDEPENDENT_AMBULATORY_CARE_PROVIDER_SITE_OTHER): Payer: BLUE CROSS/BLUE SHIELD | Admitting: Allergy and Immunology

## 2016-01-14 ENCOUNTER — Encounter: Payer: Self-pay | Admitting: Allergy and Immunology

## 2016-01-14 VITALS — BP 124/94 | HR 88 | Resp 18

## 2016-01-14 DIAGNOSIS — J4540 Moderate persistent asthma, uncomplicated: Secondary | ICD-10-CM | POA: Diagnosis not present

## 2016-01-14 DIAGNOSIS — H1010 Acute atopic conjunctivitis, unspecified eye: Secondary | ICD-10-CM

## 2016-01-14 DIAGNOSIS — Z91018 Allergy to other foods: Secondary | ICD-10-CM

## 2016-01-14 DIAGNOSIS — J309 Allergic rhinitis, unspecified: Secondary | ICD-10-CM

## 2016-01-14 DIAGNOSIS — L209 Atopic dermatitis, unspecified: Secondary | ICD-10-CM | POA: Diagnosis not present

## 2016-01-14 MED ORDER — TRIAMCINOLONE ACETONIDE 0.1 % EX CREA: TOPICAL_CREAM | CUTANEOUS | Status: DC

## 2016-01-14 NOTE — Patient Instructions (Addendum)
  1. Continue Asmanex 220 two inhalation 1 time per day and increase to 2 inhalations twice a day during asthma flareup.  2. Continue Flonase 2 sprays each nostril one time per day  3. Increase ranitidine to 300 mg 1 time per day  4. Use triamcinolone 0.1% cream applied to skin one to 2 times per day as needed  5. Use antihistamine as needed  6. Use ProAir HFA as needed  7. Continue nucala. Submit for Dupilumab  8. Return to clinic in 3 months or earlier if problem

## 2016-01-14 NOTE — Progress Notes (Signed)
Follow-up Note  Referring Provider: [name], MD Primary Provider: [name], MD Date of Office Visit: 01/14/2016  Subjective:   Megan Lynn (DOB: [birth year]) is a 45 y.o. female [facility] in re-evaluation of the following:  HPI: [name] returns to this clinic in evaluation of her multiorgan atopic disease manifested as asthma, allergic rhinoconjunctivitis, severe atopic dermatitis, and food allergy. I have not seen her in his clinic since November 2016.  In January she apparently required a antibiotic and a systemic steroid to treat an exacerbation of her asthma precipitated by an episode of sinusitis. Likewise, she developed another exacerbation of her asthma while at the beach earlier this month and required another dose of systemic steroids. Her requirement for a bronchodilator on an average basis is about 3 times a week. She continues to use Asmanex on a regular basis. She continues on mesolizumab. She's tried multiple inhalers in the past none of which she thinks that is helped her including fixed combination drugs and anticholinergic agents.  Her atopic dermatitis is still a very active issue. She uses a combination of various type of topical agents including mometasone triamcinolone and clobetasol and hydrocortisone. She doesn't really think any of these work very well. She has given up on therapy for the most part.  Her nose is been doing quite well as long she continues to use a nasal steroid on a regular basis.  She remains away from all crab given her previous history of reaction.  She has reflux which is still somewhat active while using an over-the-counter ranitidine 150 mg 1 time per day. This is most active when she eats spicy food. She did have a spontaneous episode of vomiting this week.    Medication List           albuterol 108 (90 Base) MCG/ACT inhaler  Commonly known as:  PROAIR HFA  Inhale 2  puffs into the lungs every 4 (four) hours as needed for wheezing or shortness of breath.     amoxicillin-clavulanate 875-125 MG tablet  Commonly known as:  AUGMENTIN  Take 1 tablet by mouth twice a day for 10 days.     beclomethasone 80 MCG/ACT inhaler  Commonly known as:  QVAR  Inhale 2 puffs into the lungs 2 (two) times daily.     benzonatate 100 MG capsule  Commonly known as:  TESSALON PERLES  Take 1-2 tablets every 8 hours as needed for cough.     benzonatate 100 MG capsule  Commonly known as:  TESSALON PERLES  Take 1-2 tablets daily as needed for cough     cyclobenzaprine 10 MG tablet  Commonly known as:  FLEXERIL  Reported on 09/10/2015     diphenhydrAMINE 12.5 MG/5ML elixir  Commonly known as:  BENADRYL  Take by mouth as needed.     Doxycycline Hyclate 200 MG Tbec  Take one tablet daily for 10 days     EPIPEN 2-PAK 0.3 mg/0.3 mL Soaj injection  Generic drug:  EPINEPHrine  Inject 0.3 mg into the muscle as needed (ALLERGIC REACTIONS).     fluconazole 150 MG tablet  Commonly known as:  DIFLUCAN  Take one tablet on day one and then the other 72 hours later.     fluticasone 50 MCG/ACT nasal spray  Commonly known as:  FLONASE  1 spray 2 (two) times daily.     fluticasone furoate-vilanterol 200-25 MCG/INH Aepb  Commonly known as:  BREO  ELLIPTA  Inhale 1 Dose into the lungs daily.     hydrOXYzine 25 MG tablet  Commonly known as:  ATARAX/VISTARIL  Take 1 tablet (25 mg total) by mouth 2 (two) times daily.     mometasone 220 MCG/INH inhaler  Commonly known as:  ASMANEX 120 METERED DOSES  Inhale 2 puffs into the lungs 2 (two) times daily. Reported on 09/05/2015     NUCALA 100 MG Solr  Generic drug:  Mepolizumab  RECONSTITUTE AS DIRECTED AND INJECT 100 MG SUBCUTANEOUSLY EVERY 4 WEEKS     predniSONE 10 MG tablet  Commonly known as:  DELTASONE  Take 1 tablet twice daily for 3 days then 1 tablet once daily for 3 days     Tiotropium Bromide Monohydrate 1.25 MCG/ACT  Aers  Commonly known as:  SPIRIVA RESPIMAT  Inhale 2 puffs into the lungs daily.     triamcinolone cream 0.1 %  Commonly known as:  KENALOG  Reported on 09/05/2015     ZYRTEC ALLERGY 10 MG tablet  Generic drug:  cetirizine  Take 10 mg by mouth daily.        Past Medical History  Diagnosis Date  . Asthma   . Food allergy     Past Surgical History  Procedure Laterality Date  . Glaucoma surgery    . Tympanostomy tube placement      Allergies  Allergen Reactions  . Erythromycin Shortness Of Breath and Rash  . Shellfish Allergy Shortness Of Breath and Rash  . Sulfa Antibiotics Shortness Of Breath, Rash and Anaphylaxis  . Doxycycline Nausea Only and Other (See Comments)    Horrible heart burn    Review of systems negative except as noted in HPI / PMHx or noted below:  Review of Systems  Constitutional: Negative.   HENT: Negative.   Eyes: Negative.   Respiratory: Negative.   Cardiovascular: Negative.   Gastrointestinal: Negative.   Genitourinary: Negative.   Musculoskeletal: Negative.   Skin: Negative.   Neurological: Negative.   Endo/Heme/Allergies: Negative.   Psychiatric/Behavioral: Negative.      Objective:   Filed Vitals:   01/14/16 1135  BP: 124/94  Pulse: 88  Resp: 18          Physical Exam  Constitutional: She is well-developed, well-nourished, and in no distress.  HENT:  Head: Normocephalic.  Right Ear: Tympanic membrane, external ear and ear canal normal.  Left Ear: Tympanic membrane, external ear and ear canal normal.  Nose: Nose normal. No mucosal edema or rhinorrhea.  Mouth/Throat: Uvula is midline, oropharynx is clear and moist and mucous membranes are normal. No oropharyngeal exudate.  Eyes: Conjunctivae are normal.  Neck: Trachea normal. No tracheal tenderness present. No tracheal deviation present. No thyromegaly present.  Cardiovascular: Normal rate, regular rhythm, S1 normal, S2 normal and normal heart sounds.   No murmur  heard. Pulmonary/Chest: Breath sounds normal. No stridor. No respiratory distress. She has no wheezes. She has no rales.  Musculoskeletal: She exhibits no edema.  Lymphadenopathy:       Head (right side): No tonsillar adenopathy present.       Head (left side): No tonsillar adenopathy present.    She has no cervical adenopathy.  Neurological: She is alert. Gait normal.  Skin: Rash (Multiple erythematous patches involving extremity and trunk.) noted. She is not diaphoretic. No erythema. Nails show no clubbing.  Psychiatric: Mood and affect normal.    Diagnostics:    Spirometry was performed and demonstrated an FEV1 of 2.29 at 67 %  of predicted.  The patient had an Asthma Control Test with the following results: ACT Total Score: 16.    Assessment and Plan:   1. Moderate persistent asthma, uncomplicated   2. Food allergy   3. Atopic dermatitis   4. Allergic rhinoconjunctivitis     1. Continue Asmanex 220 two inhalation 1 time per day and increase to 2 inhalations twice a day during asthma flareup.  2. Continue Flonase 2 sprays each nostril one time per day  3. Increase ranitidine to 300 mg 1 time per day  4. Use triamcinolone 0.1% cream applied to skin one to 2 times per day as needed  5. Use antihistamine and epi-pen as needed  6. Use ProAir HFA as needed  7. Continue nucala. Submit for Dupilumab  8. Return to clinic in 3 months or earlier if problem  Bryah is certainly not under good control regarding her multiorgan atopic disease even in the face of utilizing her nucala. Part of this is on the basis of her severity of her atopic disease and part of it is on the basis of some noncompliance and some frustration about her lack of response to various medications. We'll try her on a different biological agent to replace her nucala as soon as we can get dupilumab approved. I'll see her back in this clinic in approximately 3 months or earlier if there is a problem.   Laurette Schimke, MD [facility]

## 2016-01-28 ENCOUNTER — Other Ambulatory Visit: Payer: Self-pay

## 2016-01-28 MED ORDER — HYDROXYZINE HCL 25 MG PO TABS: 25.0000 mg | ORAL_TABLET | Freq: Two times a day (BID) | ORAL | Status: DC

## 2016-01-31 ENCOUNTER — Other Ambulatory Visit: Payer: Self-pay | Admitting: Allergy and Immunology

## 2016-02-09 ENCOUNTER — Ambulatory Visit (INDEPENDENT_AMBULATORY_CARE_PROVIDER_SITE_OTHER): Payer: BLUE CROSS/BLUE SHIELD

## 2016-02-09 DIAGNOSIS — J4540 Moderate persistent asthma, uncomplicated: Secondary | ICD-10-CM | POA: Diagnosis not present

## 2016-02-17 ENCOUNTER — Telehealth: Payer: Self-pay | Admitting: Allergy and Immunology

## 2016-02-17 MED ORDER — PREDNISONE 10 MG PO TABS: ORAL_TABLET | ORAL | Status: DC

## 2016-02-17 NOTE — Telephone Encounter (Signed)
Please have patient use prednisone 10mg  one tablet twice a day for four days then one tablet one a day for 10 days. Also, let's submit for Dupilumab to replace her mepolizumab.

## 2016-02-17 NOTE — Telephone Encounter (Addendum)
SPOKE TO PATIENT ADVISED AS WRITTEN BELOW PREDNISONE SENT IN message sent to Tammy to submit for medication

## 2016-02-17 NOTE — Telephone Encounter (Signed)
She is having a severe breakout of her eczema which is causing her asthma to give her problems. She is taking a youth group out of town tomorrow and would like to know what she can do to help this. Please advise. Thanks  [facility] on [address]

## 2016-02-26 ENCOUNTER — Telehealth: Payer: Self-pay | Admitting: Allergy and Immunology

## 2016-02-26 NOTE — Telephone Encounter (Signed)
Pt called and wanted to talk with Megan Lynn about what going on. She said that when she finish the predisone all her symptons came back and also she is having trouble breathing. I asked her to come in and she said no.

## 2016-02-27 NOTE — Telephone Encounter (Signed)
TRIED TO CALL PATIENT AND MAILBOX FULL UNABLE TO LEAVE MESSAGE

## 2016-03-04 NOTE — Telephone Encounter (Signed)
Tried to call patient again voicemail full. If patient calls [place]  Or [facility] I have sent patient info regarding Dupixent that I need to discuss with her

## 2016-03-08 ENCOUNTER — Ambulatory Visit (INDEPENDENT_AMBULATORY_CARE_PROVIDER_SITE_OTHER): Payer: BLUE CROSS/BLUE SHIELD | Admitting: *Deleted

## 2016-03-08 DIAGNOSIS — J4550 Severe persistent asthma, uncomplicated: Secondary | ICD-10-CM

## 2016-03-15 NOTE — Telephone Encounter (Signed)
HAVE TRY TO CALL PATIENT MULTIPLE TIMES AND MAILED LETTER TO PATIENT WITH NO RESPONSE

## 2016-03-19 ENCOUNTER — Telehealth: Payer: Self-pay | Admitting: *Deleted

## 2016-03-19 MED ORDER — PREDNISONE 10 MG PO TABS: 20.0000 mg | ORAL_TABLET | Freq: Every day | ORAL | Status: DC

## 2016-03-19 NOTE — Telephone Encounter (Signed)
PATIENT CALLED ADVISED THAT DERMATITIS FLARED UP COVERING ARMS AND LEGS. USING TRIAMCINOLONE CREAM AND HYDROXYZINE WITH NO RELIEF.   PER DR KOZLOW GIVE PATIENT PREDNISONE 20MG  DAILY FOR 10 DAYS. IF NO IMPROVEMENT MAKE APPT TO SEE HIM.  PATIENT ADVISED OF Arvid RightINSTRUX /RX SENT

## 2016-03-19 NOTE — Telephone Encounter (Signed)
L/M FOR PATIENT TO CALL ME REGARDING CHANGE ON RX NUCALA TO DUPIXENT. HAD TRIED SEVERAL TIMES BUT VOICE MAIL FULL, FINALLY ABLE TO LEAVE MESSAGE.  HER RX DUPIXENT READY AT [facility] THEY HAD CXL DUE TO INABILITY TO REACH PT ALSO

## 2016-03-23 ENCOUNTER — Telehealth: Payer: Self-pay | Admitting: Allergy and Immunology

## 2016-04-02 ENCOUNTER — Ambulatory Visit (INDEPENDENT_AMBULATORY_CARE_PROVIDER_SITE_OTHER): Payer: BLUE CROSS/BLUE SHIELD

## 2016-04-02 ENCOUNTER — Other Ambulatory Visit: Payer: Self-pay | Admitting: Allergy and Immunology

## 2016-04-02 DIAGNOSIS — L209 Atopic dermatitis, unspecified: Secondary | ICD-10-CM | POA: Diagnosis not present

## 2016-04-02 NOTE — Progress Notes (Signed)
Immunotherapy   Patient Details  Name: Megan Lynn MRN: [id number] Date of Birth: [birth year]  04/02/2016  [name]  Gave her second injection of Dupixent. I gave her the first injection subq in her left deltoid region.   Frequency: every 14 days [name] 04/02/2016, 1:57 PM

## 2016-05-29 ENCOUNTER — Other Ambulatory Visit: Payer: Self-pay | Admitting: Allergy and Immunology

## 2016-07-20 ENCOUNTER — Other Ambulatory Visit: Payer: Self-pay | Admitting: Allergy and Immunology

## 2016-07-21 ENCOUNTER — Telehealth: Payer: Self-pay | Admitting: *Deleted

## 2016-07-21 NOTE — Telephone Encounter (Signed)
L/M for patient needs appt to follow up for Dupixent renewal and followup for asthma and call [place] office to make appt

## 2016-08-11 ENCOUNTER — Encounter: Payer: Self-pay | Admitting: Allergy & Immunology

## 2016-08-11 ENCOUNTER — Ambulatory Visit (INDEPENDENT_AMBULATORY_CARE_PROVIDER_SITE_OTHER): Payer: BLUE CROSS/BLUE SHIELD | Admitting: Allergy & Immunology

## 2016-08-11 ENCOUNTER — Encounter (INDEPENDENT_AMBULATORY_CARE_PROVIDER_SITE_OTHER): Payer: Self-pay

## 2016-08-11 VITALS — BP 133/88 | HR 105 | Temp 98.3°F | Resp 18 | Ht 69.0 in | Wt 311.0 lb

## 2016-08-11 DIAGNOSIS — L2084 Intrinsic (allergic) eczema: Secondary | ICD-10-CM | POA: Diagnosis not present

## 2016-08-11 DIAGNOSIS — J3089 Other allergic rhinitis: Secondary | ICD-10-CM | POA: Diagnosis not present

## 2016-08-11 DIAGNOSIS — J4540 Moderate persistent asthma, uncomplicated: Secondary | ICD-10-CM | POA: Diagnosis not present

## 2016-08-11 DIAGNOSIS — J019 Acute sinusitis, unspecified: Secondary | ICD-10-CM

## 2016-08-11 DIAGNOSIS — H1045 Other chronic allergic conjunctivitis: Secondary | ICD-10-CM

## 2016-08-11 MED ORDER — FLUCONAZOLE 100 MG PO TABS: 100.0000 mg | ORAL_TABLET | Freq: Every day | ORAL | 1 refills | Status: DC

## 2016-08-11 MED ORDER — ALBUTEROL SULFATE HFA 108 (90 BASE) MCG/ACT IN AERS: 2.0000 | INHALATION_SPRAY | RESPIRATORY_TRACT | 1 refills | Status: DC | PRN

## 2016-08-11 MED ORDER — CRISABOROLE 2 % EX OINT: 1.0000 "application " | TOPICAL_OINTMENT | Freq: Every day | CUTANEOUS | 2 refills | Status: DC

## 2016-08-11 MED ORDER — TRIAMCINOLONE ACETONIDE 0.1 % EX CREA: 1.0000 "application " | TOPICAL_CREAM | Freq: Two times a day (BID) | CUTANEOUS | 3 refills | Status: DC

## 2016-08-11 MED ORDER — BECLOMETHASONE DIPROPIONATE 80 MCG/ACT IN AERS: 2.0000 | INHALATION_SPRAY | Freq: Two times a day (BID) | RESPIRATORY_TRACT | 5 refills | Status: DC

## 2016-08-11 MED ORDER — AMOXICILLIN-POT CLAVULANATE 875-125 MG PO TABS: 1.0000 | ORAL_TABLET | Freq: Two times a day (BID) | ORAL | 0 refills | Status: AC

## 2016-08-11 NOTE — Patient Instructions (Addendum)
1. Intrinsic atopic dermatitis - Continue with Dupixent every 14 days. - Try holding the hydroxyzine and instead adding cetirizine 30mg  (three tablets) at night to help with itching. - We will send in a prescription for Eucrisa ointment. - Continue with triamcinolone as needed.  2. Moderate persistent asthma, uncomplicated - Lung function looks good today. - Daily controller medication(s): NONE - Rescue medications: ProAir 4 puffs every 4-6 hours as needed - Changes during respiratory infections or worsening symptoms: start Qvar 80mcg 4 puffs twice daily for TWO WEEKS. - Asthma control goals:  * Full participation in all desired activities (may need albuterol before activity) * Albuterol use two time or less a week on average (not counting use with activity) * Cough interfering with sleep two time or less a month * Oral steroids no more than once a year * No hospitalizations  3. Acute sinusitis - Start Augmentin 875mg  twice daily for 14 days. - We will send in a dose of fluconazole to use in case you develop a yeast infection - Continue with nasal saline rinses.   4. Chronic nonseasonal allergic rhinitis due to fungal spores - Continue with Flonase 2 sprays per nostril daily as well as cetirizine 10mg .   5. Return in about 3 months (around 11/09/2016).  Please inform us of any Emergency Department visits, hospitalizations, or changes in symptoms. Call us before going to the ED for breathing or allergy symptoms since we might be able to fit you in for a sick visit. Feel free to contact us anytime with any questions, problems, or concerns.  It was a pleasure to meet you today! Have a wonderful holiday season!   Websites that have reliable patient information: 1. American Academy of Asthma, Allergy, and Immunology: www.aaaai.org 2. Food Allergy Research and Education (FARE): foodallergy.org 3. Mothers of Asthmatics: http://www.asthmacommunitynetwork.org 4. American College of Allergy,  Asthma, and Immunology: www.acaai.org

## 2016-08-11 NOTE — Progress Notes (Signed)
FOLLOW UP  Date of Service/Encounter:  08/11/16   Assessment:   Moderate persistent asthma, uncomplicated - Plan: Spirometry with Graph  Intrinsic atopic dermatitis  Acute sinusitis, recurrence not specified, unspecified location  Chronic nonseasonal allergic rhinitis due to fungal spores   Conjunctivitis - secondary to Dupixent   Asthma Reportables:  Severity: moderate persistent  Risk: low Control: well controlled  Seasonal Influenza Vaccine: yes     Plan/Recommendations:   1. Intrinsic atopic dermatitis - Continue with Dupixent every 14 days. - Try holding the hydroxyzine and instead adding cetirizine 30mg  (three tablets) at night to help with itching. - We will send in a prescription for Eucrisa ointment (samples provided) - Continue with triamcinolone as needed. - Patient is tolerating the conjunctivitis and it is being treated with steroid eye drops (prescribed by an ophthalmologist)  2. Moderate persistent asthma, uncomplicated - Lung function looks good today and her symptoms are well controlled with the Dupixent alone. - Daily controller medication(s): NONE - Rescue medications: ProAir 4 puffs every 4-6 hours as needed - Changes during respiratory infections or worsening symptoms: start Qvar 4 puffs twice daily for TWO WEEKS. - Asthma control goals:  * Full participation in all desired activities (may need albuterol before activity) * Albuterol use two time or less a week on average (not counting use with activity) * Cough interfering with sleep two time or less a month * Oral steroids no more than once a year * No hospitalizations  3. Acute sinusitis - Start Augmentin 875mg  twice daily for 14 days. - We will send in a dose of fluconazole to use in case you develop a yeast infection - Continue with nasal saline rinses.   4. Chronic nonseasonal allergic rhinitis due to fungal spores - Continue with Flonase 2 sprays per nostril daily as well as  cetirizine 10mg .  - Could consider immunotherapy in the future, although at this point her symptoms are well controlled without this.   5. Return in about 3 months (around 11/09/2016).     Subjective:   Megan Lynn is a 45 y.o. female presenting today for follow up of  Chief Complaint  Patient presents with  . Asthma    Follow up  . Allergic Rhinitis     follow up  . Eczema    Follow up  .  Megan Lynn has a history of the following: Patient Active Problem List   Diagnosis Date Noted  . Asthma with acute exacerbation 08/29/2015  . Acute sinusitis 08/29/2015  . Severe persistent asthma 05/08/2015  . Allergic rhinitis 05/08/2015  . Dermatitis 05/08/2015  . Food allergy 05/08/2015    History obtained from: chart review and patient.  Megan Lynn was referred by Megan Mina, MD.     Megan Lynn is a 45 y.o. female presenting for a follow up visit. Megan Lynn was last seen in May 2017 by Dr. Lucie Lynn. At that time, she was doing well from an asthma perspective. However, her atopic dermatitis was still very active. She was using multiple steroid ointments without improvement. She was continued on Asmanex 220 g 2 inhalations daily, Flonase 2 sprays per nostril daily, ranitidine 300 mix daily, and her topical ointments. At that visit, she was on Nucala, however she was transitioned to dupilumab due to her atopic dermatitis. Her first dose of dupilumab was in August 2017.   Since the last visit, she has done well. She is tolerating the dupilumab well. She is getting this every two  weeks, administering it at home. She is having no reactions to it aside from conjunctivitis. She has a history of eye problems involving swelling of the optic nerve. As a result of this, she has undergone bilateral trabeculectomies as a means of controlling her glaucoma. Because of this as well as the conjunctivitis secondary to the Dupixent, she has been placed on Durazol steroid eye drops  which helps control the inflammation of the trabeculectomies as well as the conjunctivitis. She is very happy with how her eyes are at this point, and in any case her eczema and asthma are so well-controlled that she would never consider giving up the Dupixent.   Her eczema is under much better control. She does still have flares on her arms which she treats with TAC as needed. She has not tried [place]. She does endorse some skin thinning with continued use of the TAC. Fleurette's asthma has been well controlled. She has not required rescue medication, experienced nocturnal awakenings due to lower respiratory symptoms, nor have activities of daily living been limited. She is using the rescue inhaler once every few days and reports that this might be "out of habit". She was on Qvar but only uses the rescue inhaler as needed. She has not had ot use her maintenacne medications for her asthma since starting the Supixent. Her last prednisone was prior to the state of Dupixent.   Allergies are under good control. She does endorse sinus pressure and "glue" in her head for 2.5 weeks. She has used saline and has used Flonase without improvement. It only really helps when she is "steaming in the shower". She bought a humidifier to try as well. However despite all of these intervention, she continues to have nasal symptoms. She has not been on an antibiotic in quite some time.   Otherwise, there have been no changes to her past medical history, surgical history, family history, or social history.    Review of Systems: a 14-point review of systems is pertinent for what is mentioned in HPI.  Otherwise, all other systems were negative. Constitutional: negative other than that listed in the HPI Eyes: negative other than that listed in the HPI Ears, nose, mouth, throat, and face: negative other than that listed in the HPI Respiratory: negative other than that listed in the HPI Cardiovascular: negative other than that  listed in the HPI Gastrointestinal: negative other than that listed in the HPI Genitourinary: negative other than that listed in the HPI Integument: negative other than that listed in the HPI Hematologic: negative other than that listed in the HPI Musculoskeletal: negative other than that listed in the HPI Neurological: negative other than that listed in the HPI Allergy/Immunologic: negative other than that listed in the HPI    Objective:   Blood pressure 133/88, pulse (!) 105, temperature 98.3 F (36.8 C), temperature source Oral, resp. rate 18, height 5\' 9"  (1.753 m), weight (!) 311 lb (141.1 kg), SpO2 96 %. Body mass index is 45.93 kg/m.   Physical Exam:  General: Alert, interactive, in no acute distress. Cooperative with the exam. Very pleasant and interactive.  Eyes: No conjunctival injection present on the right, No conjunctival injection present on the left, PERRL bilaterally, No discharge on the right and No discharge on the left Ears: Right TM pearly gray with normal light reflex, Left TM pearly gray with normal light reflex, Right TM intact without perforation and Left TM intact without perforation.  Nose/Throat: External nose within normal limits and septum midline,  turbinates edematous and pale with thick discharge, post-pharynx erythematous with cobblestoning in the posterior oropharynx. Tonsils 3+ without exudates Neck: Supple without thyromegaly. Lungs: Clear to auscultation without wheezing, rhonchi or rales. No increased work of breathing. CV: Normal S1/S2, no murmurs. Capillary refill <2 seconds.  Skin: Warm and dry, without lesions or rashes. Neuro:   Grossly intact. No focal deficits appreciated. Responsive to questions.   Diagnostic studies:  Spirometry: results normal (FEV1: 2.37/77%, FVC: 2.90/77%, FEV1/FVC: 81%).    Spirometry consistent with normal pattern.    Allergy Studies: None      Malachi BondsJoel Burnette Sautter, MD [facility]  [place]

## 2016-09-04 ENCOUNTER — Other Ambulatory Visit: Payer: Self-pay | Admitting: Allergy and Immunology

## 2016-09-24 ENCOUNTER — Telehealth: Payer: Self-pay

## 2016-09-24 NOTE — Telephone Encounter (Signed)
Pt clld in advsng her husband Margrett RudChristopher Burnett would be coming instead of her to pick up her Dupixent due to it being shipped to our office instead of [facility]. Reviewed with Waynetta SandyBeth, approved. Verified Spouse via drivers license and wrote a note to be put in book or chart as well.

## 2016-09-30 IMAGING — CR RIGHT FOOT COMPLETE - 3+ VIEW
3 series · 3 of 3 positions shown · non-contrast
Comparison: None

CLINICAL DATA: Swollen RIGHT foot with increased swelling and
discoloration at second toe, no known trauma

EXAM:
RIGHT FOOT COMPLETE - 3+ VIEW

[foot ap]
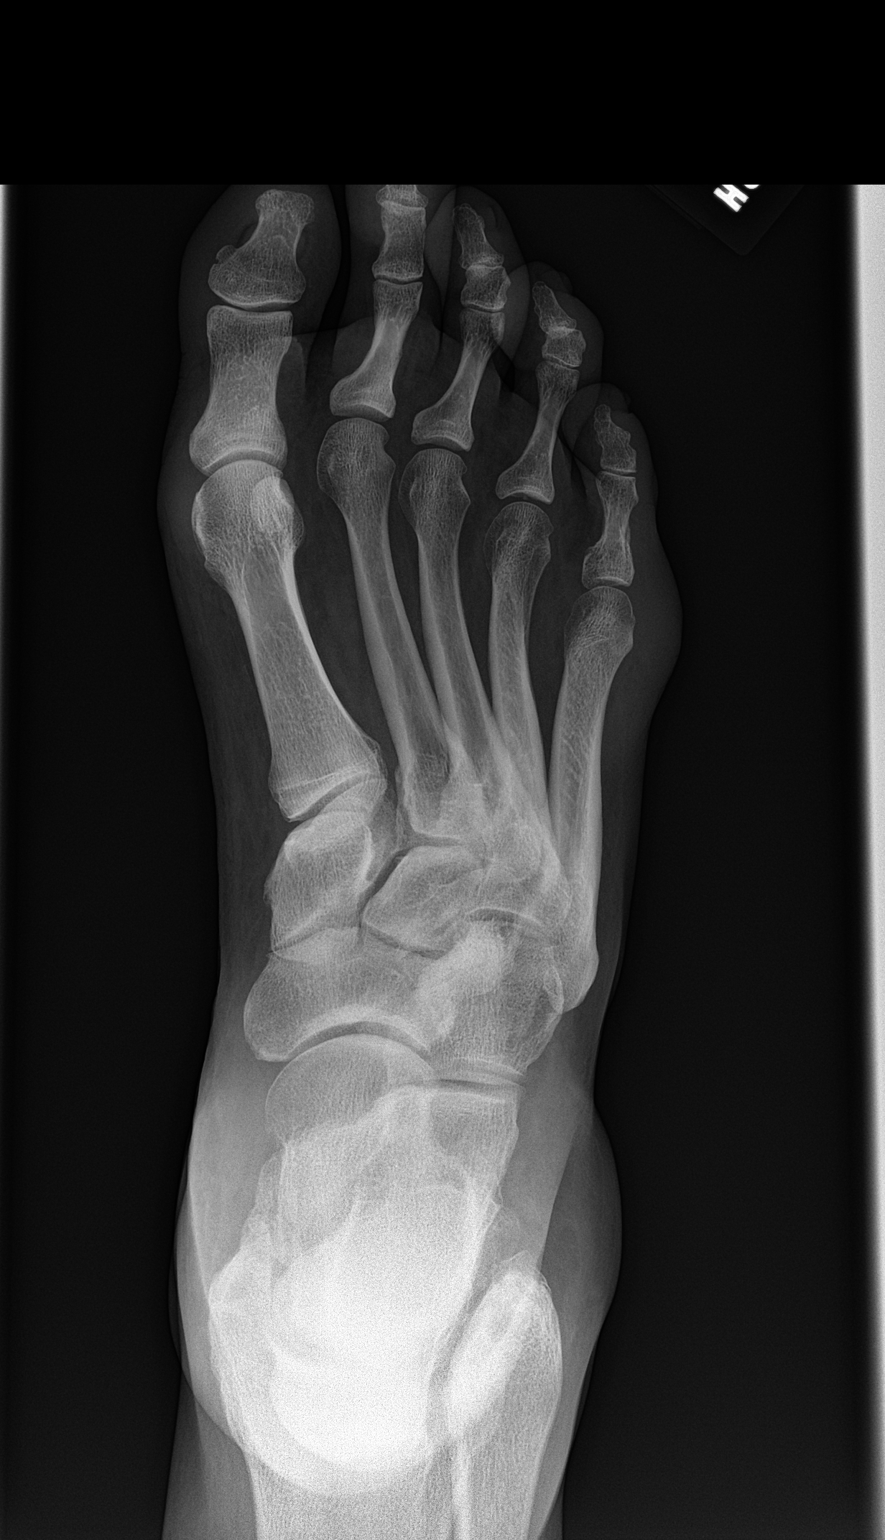

[foot obl]
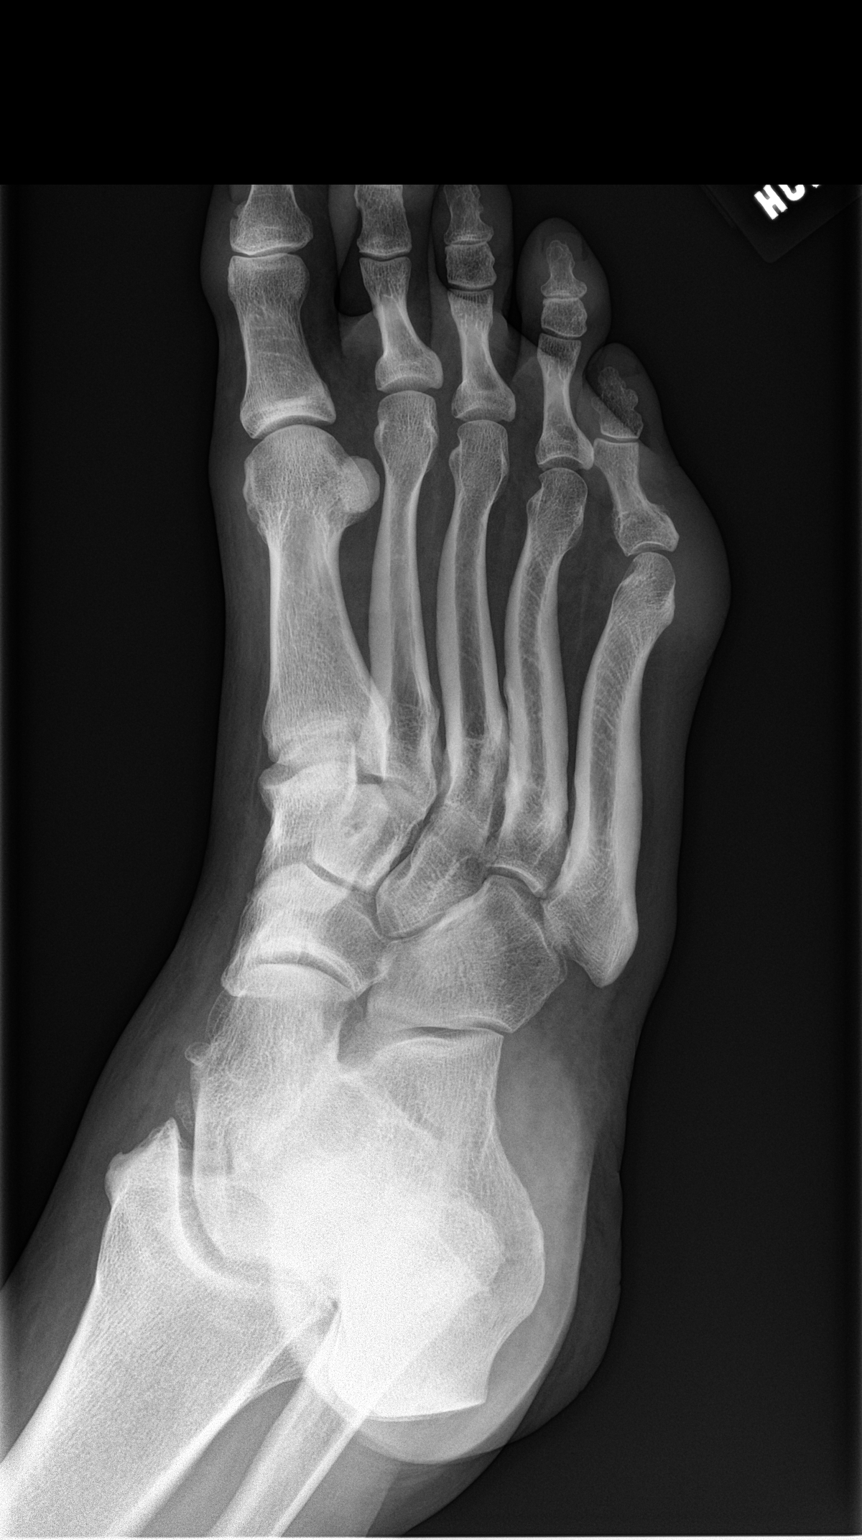

[foot lat]
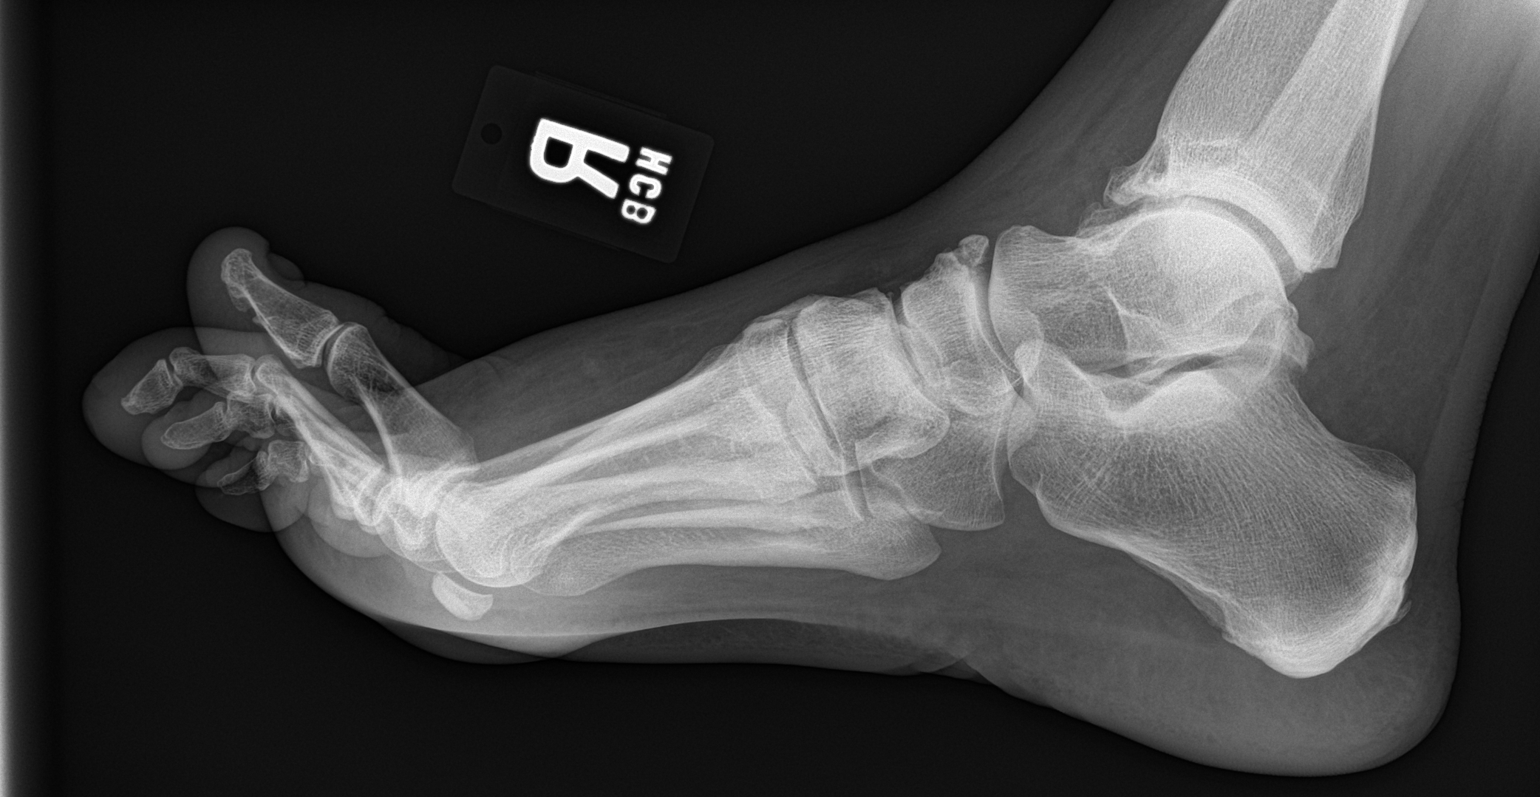

[3 of 3 positions shown; findings below may reference images not displayed]

FINDINGS: Osseous mineralization normal.

Joint spaces preserved.

Soft tissue swelling greatest at dorsum of foot and at second toe.

No acute fracture, dislocation, or bone destruction.
IMPRESSION: No acute osseous abnormalities.

## 2017-02-23 ENCOUNTER — Other Ambulatory Visit: Payer: Self-pay | Admitting: Allergy and Immunology

## 2017-02-23 ENCOUNTER — Other Ambulatory Visit: Payer: Self-pay | Admitting: Allergy & Immunology

## 2017-03-28 ENCOUNTER — Other Ambulatory Visit: Payer: Self-pay | Admitting: Allergy and Immunology

## 2017-05-09 ENCOUNTER — Telehealth: Payer: Self-pay | Admitting: *Deleted

## 2017-05-09 MED ORDER — PREDNISONE 10 MG PO TABS: 20.0000 mg | ORAL_TABLET | Freq: Every day | ORAL | 0 refills | Status: AC

## 2017-05-09 NOTE — Telephone Encounter (Signed)
Sent script into pharmacy for Prednisone. Patient informed and reminded her to keep a close check on her blood sugar.

## 2017-05-09 NOTE — Telephone Encounter (Signed)
Prednisone 10 mg tablets - 2 tablets one time per day for 10 days only. Check blood sugars while using

## 2017-05-09 NOTE — Telephone Encounter (Signed)
Megan HatchetSheila is calling to let Dr. Lucie LeatherKozlow know that her asthma and her eczema are both flaring up. It started about 4 days ago and is progressively worsening. She would like to know if Prednisone could be called in for her.  Please advise.    [facility] [address] [place]

## 2017-05-09 NOTE — Addendum Note (Signed)
Addended by: Clifton JamesLARK, HEATHER L on: 05/09/2017 02:09 PM   Modules accepted: Orders

## 2017-05-24 ENCOUNTER — Ambulatory Visit (INDEPENDENT_AMBULATORY_CARE_PROVIDER_SITE_OTHER): Payer: BLUE CROSS/BLUE SHIELD | Admitting: Allergy and Immunology

## 2017-05-24 VITALS — BP 132/80 | HR 100 | Resp 20

## 2017-05-24 DIAGNOSIS — J4530 Mild persistent asthma, uncomplicated: Secondary | ICD-10-CM | POA: Diagnosis not present

## 2017-05-24 DIAGNOSIS — L2089 Other atopic dermatitis: Secondary | ICD-10-CM

## 2017-05-24 DIAGNOSIS — H10403 Unspecified chronic conjunctivitis, bilateral: Secondary | ICD-10-CM | POA: Diagnosis not present

## 2017-05-24 DIAGNOSIS — J3089 Other allergic rhinitis: Secondary | ICD-10-CM | POA: Diagnosis not present

## 2017-05-24 NOTE — Patient Instructions (Addendum)
  1.  Continue 'action plan' for asthma flare up including QVAR 80 REDIHALER 3 inhalations three times per day  2. Continue Flonase 1-2 sprays each nostril one time per day  3. Start Mometasone 0.1% cream applied to skin 1-7 times per week depending on eczema activity (200gms)  4. Use antihistamine (Hydroxyzine / cetirizine) if needed  5. Use ProAir HFA as needed  6. Continue Dupilumab injections  8. Continue steroid eyes drops from ophthalmologist  9. Return to clinic in 6 months or earlier if problem  10. Obtain fall flu vaccine

## 2017-05-24 NOTE — Progress Notes (Signed)
Follow-up Note  Referring Provider: [name], MD Primary Provider: [name], MD Date of Office Visit: 05/24/2017  Subjective:   Megan Lynn (DOB: 1970-11-18) is a 46 y.o. female who [facility] in re-evaluation of the following:  HPI: Megan Lynn returns to this clinic in reevaluation of her severe atopic dermatitis and history of asthma and allergic rhinitis. I have not seen her in his clinic since May 2017 at which point in time we started her on dupilumab injections.  Dupilumab has made an amazing improvement regarding her severe atopic dermatitis. Although she does have some chronic itchiness and inflammation of her skin it is nothing as it was in the past and she no longer goes through these episodes of asthma and allergic rhinitis and atopic dermatitis flares that requires prednisone several times per year. Presently she is not using any topical steroid. She does continue to use a nasal steroid. She does not use a inhaled steroid on a regular basis.  She did develop problems with conjunctivitis while using her dupilumab and her ophthalmologist has her using a topical steroid eyedrop about 1 time every 2 weeks or so. She appears to have had diminishing activity of her conjunctivitis while she continues to use dupilumab.  Unfortunately, about 2 weeks ago she developed nasal congestion and sneezing and clear rhinorrhea and wheezing and coughing and had to use her bronchodilator and had a flare of her skin condition. She does not remember a fever or ugly nasal discharge. She took prednisone and she is much better at this point in time other then her skin is still somewhat active.  Allergies as of 05/24/2017      Reactions   Erythromycin Shortness Of Breath, Rash   Shellfish Allergy Shortness Of Breath, Rash   Sulfa Antibiotics Shortness Of Breath, Rash, Anaphylaxis   Doxycycline Nausea Only, Other (See Comments)   Horrible  heart burn      Medication List      beclomethasone 80 MCG/ACT inhaler Commonly known as:  QVAR Inhale 2 puffs into the lungs 2 (two) times daily.   BENADRYL PO Take by mouth as needed.   Dupilumab 300 MG/2ML Sosy Commonly known as:  DUPIXENT Inject 300 mg into the skin every 14 (fourteen) days.   EPIPEN 2-PAK 0.3 mg/0.3 mL Soaj injection Generic drug:  EPINEPHrine Inject 0.3 mg into the muscle as needed (ALLERGIC REACTIONS).   fluticasone 50 MCG/ACT nasal spray Commonly known as:  FLONASE INSTILL 1 SPRAY INTO EACH NOSTRIL TWICE DAILY FOR STUFFY NOSE/DRAINAGE   hydrOXYzine 25 MG tablet Commonly known as:  ATARAX/VISTARIL take 1 tablet by mouth twice a day if needed   prednisoLONE acetate 1 % ophthalmic suspension Commonly known as:  PRED FORTE INSTILL 1 DROP INTO AFFECTED EYE(S) TWICE DAILY   PROAIR HFA 108 (90 Base) MCG/ACT inhaler Generic drug:  albuterol inhale 2 puffs INTO THE LUNGS every 4 hours if needed for wheezing or shortness of breath   triamcinolone cream 0.1 % Commonly known as:  KENALOG APPLY TOPICALLY TO AFFECTED AREA OF SKIN 1 TO 2 TIMES DAILY IF NEEDED   ZYRTEC ALLERGY 10 MG tablet Generic drug:  cetirizine Take 10 mg by mouth daily.       Past Medical History:  Diagnosis Date  . Asthma   . Food allergy     Past Surgical History:  Procedure Laterality Date  . GLAUCOMA SURGERY    . TYMPANOSTOMY TUBE PLACEMENT  Review of systems negative except as noted in HPI / PMHx or noted below:  Review of Systems  Constitutional: Negative.   HENT: Negative.   Eyes: Negative.   Respiratory: Negative.   Cardiovascular: Negative.   Gastrointestinal: Negative.   Genitourinary: Negative.   Musculoskeletal: Negative.   Skin: Negative.   Neurological: Negative.   Endo/Heme/Allergies: Negative.   Psychiatric/Behavioral: Negative.      Objective:   Vitals:   05/24/17 1542  BP: 132/80  Pulse: 100  Resp: 20          Physical Exam    Constitutional: She is well-developed, well-nourished, and in no distress.  HENT:  Head: Normocephalic.  Right Ear: Tympanic membrane, external ear and ear canal normal.  Left Ear: Tympanic membrane, external ear and ear canal normal.  Nose: Nose normal. No mucosal edema or rhinorrhea.  Mouth/Throat: Uvula is midline, oropharynx is clear and moist and mucous membranes are normal. No oropharyngeal exudate.  Eyes: Conjunctivae are normal.  Neck: Trachea normal. No tracheal tenderness present. No tracheal deviation present. No thyromegaly present.  Cardiovascular: Normal rate, regular rhythm, S1 normal, S2 normal and normal heart sounds.   No murmur heard. Pulmonary/Chest: Breath sounds normal. No stridor. No respiratory distress. She has no wheezes. She has no rales.  Musculoskeletal: She exhibits no edema.  Lymphadenopathy:       Head (right side): No tonsillar adenopathy present.       Head (left side): No tonsillar adenopathy present.    She has no cervical adenopathy.  Neurological: She is alert. Gait normal.  Skin: Rash (lichenification and slight erythema affecting upper extremities and anterior chest.) noted. She is not diaphoretic. No erythema. Nails show no clubbing.  Psychiatric: Mood and affect normal.    Diagnostics:    Spirometry was performed and demonstrated an FEV1 of 2.43 at 83 % of predicted.  The patient had an Asthma Control Test with the following results: ACT Total Score: 14.    Assessment and Plan:   1. Other atopic dermatitis   2. Asthma, well controlled, mild persistent   3. Other allergic rhinitis   4. Chronic conjunctivitis of both eyes, unspecified chronic conjunctivitis type     1. Continue 'action plan' for asthma flare up including QVAR 80 REDIHALER 3 inhalations three times per day  2. Continue Flonase 1-2 sprays each nostril one time per day  3. Start Mometasone 0.1% cream applied to skin 1-7 times per week depending on eczema activity  (200gms)  4. Use antihistamine (Hydroxyzine / cetirizine) if needed  5. Use ProAir HFA as needed  6. Continue Dupilumab injections  8. Continue steroid eyes drops from ophthalmologist  9. Return to clinic in 6 months or earlier if problem  10. Obtain fall flu vaccine  Overall Megan Lynn has had a very good improvement while using dupilumab and this treatment has decreased her frequency of steroid administration to less than 1 time per year. However, she still has some chronic inflammation of her skin and I have encouraged her to use some dose of topical mometasone as noted above. We will not give her a daily controller agent for her asthma as this is under excellent control and she will continue to use Flonase for her upper airway issue. Her administration of systemic steroids recently appeared to be secondary to a flare up precipitated by a viral respiratory tract infection and this is her only dose of systemic steroids in over a year. If she does well I will see her  in his clinic in 6 months or earlier if there is a problem.  Laurette Schimke, MD Allergy / Immunology [facility]

## 2017-05-25 ENCOUNTER — Telehealth: Payer: Self-pay | Admitting: Allergy and Immunology

## 2017-05-25 ENCOUNTER — Other Ambulatory Visit: Payer: Self-pay | Admitting: *Deleted

## 2017-05-25 ENCOUNTER — Encounter: Payer: Self-pay | Admitting: Allergy and Immunology

## 2017-05-25 MED ORDER — ALBUTEROL SULFATE HFA 108 (90 BASE) MCG/ACT IN AERS: 2.0000 | INHALATION_SPRAY | Freq: Four times a day (QID) | RESPIRATORY_TRACT | 3 refills | Status: DC | PRN

## 2017-05-25 MED ORDER — HYDROXYZINE HCL 25 MG PO TABS
25.0000 mg | ORAL_TABLET | Freq: Every day | ORAL | 3 refills | Status: DC
Start: 2017-05-25 — End: 2017-05-25

## 2017-05-25 MED ORDER — MOMETASONE FUROATE 0.1 % EX CREA: 1.0000 "application " | TOPICAL_CREAM | Freq: Every day | CUTANEOUS | 3 refills | Status: DC

## 2017-05-25 MED ORDER — HYDROXYZINE HCL 25 MG PO TABS: 25.0000 mg | ORAL_TABLET | Freq: Every day | ORAL | 3 refills | Status: DC

## 2017-05-25 MED ORDER — FLUTICASONE PROPIONATE 50 MCG/ACT NA SUSP: 1.0000 | Freq: Two times a day (BID) | NASAL | 5 refills | Status: DC

## 2017-05-25 MED ORDER — BECLOMETHASONE DIPROP HFA 80 MCG/ACT IN AERB: 2.0000 | INHALATION_SPRAY | Freq: Two times a day (BID) | RESPIRATORY_TRACT | 5 refills | Status: DC

## 2017-05-25 MED ORDER — FLUTICASONE PROPIONATE 50 MCG/ACT NA SUSP: 1.0000 | Freq: Two times a day (BID) | NASAL | 5 refills | Status: AC

## 2017-05-25 NOTE — Telephone Encounter (Signed)
Script sent  

## 2017-05-25 NOTE — Telephone Encounter (Signed)
Pt called and said none of her meds was [facility] [address] [phone]. Hydroxyzine, Qvar Redihaler, Flonase, Mometasone, Proair.

## 2017-05-30 ENCOUNTER — Telehealth: Payer: Self-pay | Admitting: Allergy and Immunology

## 2017-05-30 NOTE — Telephone Encounter (Signed)
Received fax for refill on Mometasone Cream. Patient was last seen on 05/24/2017 in which a script for this medication was sent in to the [facility] on [address].

## 2017-06-20 ENCOUNTER — Other Ambulatory Visit: Payer: Self-pay | Admitting: *Deleted

## 2017-06-20 ENCOUNTER — Telehealth: Payer: Self-pay | Admitting: Allergy and Immunology

## 2017-06-20 MED ORDER — HYDROXYZINE HCL 25 MG PO TABS: ORAL_TABLET | ORAL | 3 refills | Status: DC

## 2017-06-20 NOTE — Telephone Encounter (Signed)
rx was already sent in through surescripts

## 2017-06-20 NOTE — Telephone Encounter (Signed)
Patient was given HYDROXYZINE by KOZLOW Patient was taking medication twice a day When the medication was called in last time, it was written for once a day - patient continued to take medication as before and has now run out or medication What was the reason for the change in the  Script?? Can she get a new script called in?? [facility] [address] [place]

## 2017-07-25 ENCOUNTER — Other Ambulatory Visit: Payer: Self-pay | Admitting: Allergy and Immunology

## 2017-07-25 MED ORDER — TRIAMCINOLONE ACETONIDE 0.1 % EX CREA: TOPICAL_CREAM | CUTANEOUS | 0 refills | Status: DC

## 2017-08-17 ENCOUNTER — Other Ambulatory Visit: Payer: Self-pay | Admitting: Allergy and Immunology

## 2017-08-17 MED ORDER — TRIAMCINOLONE ACETONIDE 0.1 % EX CREA: TOPICAL_CREAM | CUTANEOUS | 0 refills | Status: DC

## 2017-08-17 NOTE — Telephone Encounter (Signed)
Received fax refill on Triamcinolone. Patient was last seen 05/24/2017. Patient is to return in 6 months. Refill sent it.

## 2017-08-19 ENCOUNTER — Other Ambulatory Visit: Payer: Self-pay

## 2017-08-19 MED ORDER — TRIAMCINOLONE ACETONIDE 0.1 % EX CREA: TOPICAL_CREAM | CUTANEOUS | 0 refills | Status: DC

## 2017-09-06 ENCOUNTER — Other Ambulatory Visit: Payer: Self-pay

## 2017-09-06 MED ORDER — TRIAMCINOLONE ACETONIDE 0.1 % EX CREA: TOPICAL_CREAM | CUTANEOUS | 0 refills | Status: DC

## 2017-09-26 ENCOUNTER — Ambulatory Visit: Payer: BLUE CROSS/BLUE SHIELD | Admitting: Allergy and Immunology

## 2017-10-12 ENCOUNTER — Other Ambulatory Visit: Payer: Self-pay | Admitting: *Deleted

## 2017-10-12 MED ORDER — HYDROXYZINE HCL 25 MG PO TABS: ORAL_TABLET | ORAL | 0 refills | Status: DC

## 2017-10-31 ENCOUNTER — Ambulatory Visit: Payer: BLUE CROSS/BLUE SHIELD | Admitting: Allergy and Immunology

## 2017-11-10 ENCOUNTER — Other Ambulatory Visit: Payer: Self-pay | Admitting: Allergy and Immunology

## 2017-11-15 ENCOUNTER — Ambulatory Visit: Payer: BLUE CROSS/BLUE SHIELD | Admitting: Allergy and Immunology

## 2017-11-15 ENCOUNTER — Encounter: Payer: Self-pay | Admitting: Allergy and Immunology

## 2017-11-15 VITALS — BP 132/82 | HR 92 | Resp 20

## 2017-11-15 DIAGNOSIS — L2089 Other atopic dermatitis: Secondary | ICD-10-CM | POA: Diagnosis not present

## 2017-11-15 DIAGNOSIS — J4530 Mild persistent asthma, uncomplicated: Secondary | ICD-10-CM

## 2017-11-15 DIAGNOSIS — J3089 Other allergic rhinitis: Secondary | ICD-10-CM | POA: Diagnosis not present

## 2017-11-15 MED ORDER — HYDROXYZINE HCL 25 MG PO TABS: ORAL_TABLET | ORAL | 5 refills | Status: DC

## 2017-11-15 NOTE — Patient Instructions (Addendum)
  1.  Continue 'action plan' for asthma flare up including QVAR 80 REDIHALER 3 inhalations three times per day  2. Continue Flonase 1-2 sprays each nostril one time per day  3. Continue Mometasone 0.1% cream applied to skin 1-7 times per week depending on eczema activity (200gms)  4. Use antihistamine (Hydroxyzine / cetirizine) if needed  5. Use ProAir HFA as needed  6. Continue Dupilumab injections and EpiPen / Auvi-Q  7. Continue steroid eyes drops from ophthalmologist  8. Return to clinic in 6 months or earlier if problem

## 2017-11-15 NOTE — Progress Notes (Signed)
Follow-up Note  Referring Provider: [name], MD Primary Provider: [name], MD Date of Office Visit: 11/15/2017  Subjective:   Megan Lynn (DOB: 1970/09/22) is a 47 y.o. female who [facility] in re-evaluation of the following:  HPI: Megan Lynn returns to this clinic in reevaluation of her severe atopic dermatitis treated with dupilumab, asthma, allergic rhinitis, and shellfish allergy.  I have not seen her in this clinic since 24 May 2017.  She has only had one respiratory event this entire 6 months.  She apparently was diagnosed with "pneumonia" with a febrile illness and respiratory tract symptoms requiring the administration of antibiotics and prednisone in December.  She did not have a chest x-ray with that episode.  She recovered well and has not really had any significant respiratory tract symptoms and rarely uses a short acting bronchodilator and has not had to activate her action plan for an asthma flare.  Her nose has been doing quite well and she has not required therapy for an episode of sinusitis.  Her skin is under excellent control on her dupilumab.  She uses topical mometasone as spot therapy to an area here and there about 1 time per week.  She continues to receive steroid eyedrops from her ophthalmologist.  There was a question of whether or not dupilumab was precipitating some degree of conjunctivitis on occasion but overall she is very stable using her current plan.  She remains away from consuming any shellfish.  She did receive the flu vaccine.  Allergies as of 11/15/2017      Reactions   Erythromycin Shortness Of Breath, Rash   Shellfish Allergy Shortness Of Breath, Rash   Sulfa Antibiotics Shortness Of Breath, Rash, Anaphylaxis   Doxycycline Nausea Only, Other (See Comments)   Horrible heart burn      Medication List      albuterol 108 (90 Base) MCG/ACT inhaler Commonly known as:   PROAIR HFA Inhale 2 puffs into the lungs every 6 (six) hours as needed for wheezing or shortness of breath.   Dupilumab 300 MG/2ML Sosy Commonly known as:  DUPIXENT Inject 300 mg into the skin every 14 (fourteen) days.   EPIPEN 2-PAK 0.3 mg/0.3 mL Soaj injection Generic drug:  EPINEPHrine Inject 0.3 mg into the muscle as needed (ALLERGIC REACTIONS).   fluticasone 50 MCG/ACT nasal spray Commonly known as:  FLONASE Place 1 spray into both nostrils 2 (two) times daily.   hydrOXYzine 25 MG tablet Commonly known as:  ATARAX/VISTARIL One tablet twice a day if needed   mometasone 0.1 % cream Commonly known as:  ELOCON Apply 1 application topically daily.   prednisoLONE acetate 1 % ophthalmic suspension Commonly known as:  PRED FORTE INSTILL 1 DROP INTO AFFECTED EYE(S) TWICE DAILY   ranitidine 150 MG tablet Commonly known as:  ZANTAC Take by mouth.   ZYRTEC ALLERGY 10 MG tablet Generic drug:  cetirizine Take 10 mg by mouth daily.       Past Medical History:  Diagnosis Date  . Asthma   . Food allergy     Past Surgical History:  Procedure Laterality Date  . GLAUCOMA SURGERY    . TYMPANOSTOMY TUBE PLACEMENT      Review of systems negative except as noted in HPI / PMHx or noted below:  Review of Systems  Constitutional: Negative.   HENT: Negative.   Eyes: Negative.   Respiratory: Negative.   Cardiovascular: Negative.   Gastrointestinal: Negative.  Genitourinary: Negative.   Musculoskeletal: Negative.   Skin: Negative.   Neurological: Negative.   Endo/Heme/Allergies: Negative.   Psychiatric/Behavioral: Negative.      Objective:   Vitals:   11/15/17 1600  BP: 132/82  Pulse: 92  Resp: 20          Physical Exam  Constitutional: She is well-developed, well-nourished, and in no distress.  HENT:  Head: Normocephalic.  Right Ear: Tympanic membrane, external ear and ear canal normal.  Left Ear: Tympanic membrane, external ear and ear canal normal.    Nose: Nose normal. No mucosal edema or rhinorrhea.  Mouth/Throat: Uvula is midline, oropharynx is clear and moist and mucous membranes are normal. No oropharyngeal exudate.  Eyes: Conjunctivae are normal.  Neck: Trachea normal. No tracheal tenderness present. No tracheal deviation present. No thyromegaly present.  Cardiovascular: Normal rate, regular rhythm, S1 normal, S2 normal and normal heart sounds.  No murmur heard. Pulmonary/Chest: Breath sounds normal. No stridor. No respiratory distress. She has no wheezes. She has no rales.  Musculoskeletal: She exhibits no edema.  Lymphadenopathy:       Head (right side): No tonsillar adenopathy present.       Head (left side): No tonsillar adenopathy present.    She has no cervical adenopathy.  Neurological: She is alert. Gait normal.  Skin: No rash noted. She is not diaphoretic. No erythema. Nails show no clubbing.  Psychiatric: Mood and affect normal.    Diagnostics:    Spirometry was performed and demonstrated an FEV1 of 2.17 at 65 % of predicted.  The patient had an Asthma Control Test with the following results: ACT Total Score: 23.    Assessment and Plan:   1. Other atopic dermatitis   2. Asthma, well controlled, mild persistent   3. Other allergic rhinitis     1.  Continue 'action plan' for asthma flare up including QVAR 80 REDIHALER 3 inhalations three times per day  2. Continue Flonase 1-2 sprays each nostril one time per day  3. Continue Mometasone 0.1% cream applied to skin 1-7 times per week depending on eczema activity (200gms)  4. Use antihistamine (Hydroxyzine / cetirizine) if needed  5. Use ProAir HFA as needed  6. Continue Dupilumab injections and EpiPen / Auvi-Q  7. Continue steroid eyes drops from ophthalmologist  8. Return to clinic in 6 months or earlier if problem  Overall Megan Lynn has really done very well on her current therapy which includes dupilumab.  This biological agent has resulted in dramatic  control of her atopic dermatitis and also appears to have affected her other atopic respiratory disease.  She will continue on the plan noted above and I will see her back in this clinic in 6 months or earlier if there is a problem.  Laurette SchimkeEric Kozlow, MD Allergy / Immunology  [facility]

## 2017-11-16 ENCOUNTER — Encounter: Payer: Self-pay | Admitting: Allergy and Immunology

## 2018-02-10 ENCOUNTER — Other Ambulatory Visit: Payer: Self-pay | Admitting: Allergy and Immunology

## 2018-04-26 ENCOUNTER — Other Ambulatory Visit: Payer: Self-pay | Admitting: Podiatry

## 2018-04-26 ENCOUNTER — Ambulatory Visit: Payer: BLUE CROSS/BLUE SHIELD | Admitting: Podiatry

## 2018-04-26 ENCOUNTER — Encounter: Payer: Self-pay | Admitting: Podiatry

## 2018-04-26 ENCOUNTER — Ambulatory Visit (INDEPENDENT_AMBULATORY_CARE_PROVIDER_SITE_OTHER): Payer: BLUE CROSS/BLUE SHIELD

## 2018-04-26 ENCOUNTER — Encounter

## 2018-04-26 DIAGNOSIS — M869 Osteomyelitis, unspecified: Secondary | ICD-10-CM

## 2018-04-26 DIAGNOSIS — M79675 Pain in left toe(s): Secondary | ICD-10-CM

## 2018-04-26 NOTE — Patient Instructions (Signed)
Pre-Operative Instructions  Congratulations, you have decided to take an important step towards improving your quality of life.  You can be assured that the doctors and staff at [facility] will be with you every step of the way.  Here are some important things you should know:  1. Plan to be at the surgery center/hospital at least 1 (one) hour prior to your scheduled time, unless otherwise directed by the surgical center/hospital staff.  You must have a responsible adult accompany you, remain during the surgery and drive you home.  Make sure you have directions to the surgical center/hospital to ensure you arrive on time. 2. If you are having surgery at [facility] or [facility], you will need a copy of your medical history and physical form from your family physician within one month prior to the date of surgery. We will give you a form for your primary physician to complete.  3. We make every effort to accommodate the date you request for surgery.  However, there are times where surgery dates or times have to be moved.  We will contact you as soon as possible if a change in schedule is required.   4. No aspirin/ibuprofen for one week before surgery.  If you are on aspirin, any non-steroidal anti-inflammatory medications (Mobic, Aleve, Ibuprofen) should not be taken seven (7) days prior to your surgery.  You make take Tylenol for pain prior to surgery.  5. Medications - If you are taking daily heart and blood pressure medications, seizure, reflux, allergy, asthma, anxiety, pain or diabetes medications, make sure you notify the surgery center/hospital before the day of surgery so they can tell you which medications you should take or avoid the day of surgery. 6. No food or drink after midnight the night before surgery unless directed otherwise by surgical center/hospital staff. 7. No alcoholic beverages 24-hours prior to surgery.  No smoking 24-hours prior or 24-hours after  surgery. 8. Wear loose pants or shorts. They should be loose enough to fit over bandages, boots, and casts. 9. Don't wear slip-on shoes. Sneakers are preferred. 10. Bring your boot with you to the surgery center/hospital.  Also bring crutches or a walker if your physician has prescribed it for you.  If you do not have this equipment, it will be provided for you after surgery. 11. If you have not been contacted by the surgery center/hospital by the day before your surgery, call to confirm the date and time of your surgery. 12. Leave-time from work may vary depending on the type of surgery you have.  Appropriate arrangements should be made prior to surgery with your employer. 13. Prescriptions will be provided immediately following surgery by your doctor.  Fill these as soon as possible after surgery and take the medication as directed. Pain medications will not be refilled on weekends and must be approved by the doctor. 14. Remove nail polish on the operative foot and avoid getting pedicures prior to surgery. 15. Wash the night before surgery.  The night before surgery wash the foot and leg well with water and the antibacterial soap provided. Be sure to pay special attention to beneath the toenails and in between the toes.  Wash for at least three (3) minutes. Rinse thoroughly with water and dry well with a towel.  Perform this wash unless told not to do so by your physician.  Enclosed: 1 Ice pack (please put in freezer the night before surgery)   1 Hibiclens skin cleaner     Pre-op instructions  If you have any questions regarding the instructions, please do not hesitate to call our office.  [place]: [address], [place], [place] [address] -- [phone]  [place]: [address] [place], [place] [address] -- [phone]  [place]: [address]  [place], [place] [address] -- [phone]  [place]: [address], [address], [place], [place] [address] -- [phone]  Website:  [email or web address] 

## 2018-04-29 NOTE — Progress Notes (Signed)
   Subjective:  47 year old female with PMHx of DM presenting today as a new patient, referred by Dr. Elijah Birkom with a chief complaint of an infection of the left 2nd toe that has been present for the past 3-4 weeks. She states he has been dealing with pain in the toe for the past 5 years. There are no modifying factors noted. She has not done anything for treatment at home. Patient is here for further evaluation and treatment.   Past Medical History:  Diagnosis Date  . Asthma   . Food allergy       Objective/Physical Exam General: The patient is alert and oriented x3 in no acute distress.  Dermatology:  Wound #1 noted to the left 2nd toe measuring 0.6 x 0.8 x 0.2 cm (LxWxD).   To the noted ulceration(s), there is no eschar. There is a moderate amount of slough, fibrin, and necrotic tissue noted. Granulation tissue and wound base is red. There is a minimal amount of serosanguineous drainage noted. There is no exposed bone muscle-tendon ligament or joint. There is no malodor. Periwound integrity is intact. Skin is warm, dry and supple bilateral lower extremities.  Vascular: Palpable pedal pulses bilaterally. No edema or erythema noted. Capillary refill within normal limits.  Neurological: Epicritic and protective threshold diminished bilaterally.   Musculoskeletal Exam: Range of motion within normal limits to all pedal and ankle joints bilateral. Muscle strength 5/5 in all groups bilateral.   Radiographic Exam: Osteolytic destruction of distal phalanx of the left 2nd toe noted consistent with osteomyelitis.   Assessment: #1 ulceration of the left 2nd toe secondary to diabetes mellitus #2 osteomyelitis distal phalanx left 2nd toe  #3 diabetes mellitus w/ peripheral neuropathy   Plan of Care:  1. Patient was evaluated. X-Rays reviewed.  2. Today we discussed the conservative versus surgical management of the presenting pathology. The patient opts for surgical management. All possible  complications and details of the procedure were explained. All patient questions were answered. No guarantees were expressed or implied. 3. Authorization for surgery was initiated today. Surgery will consist of partial 2nd toe amputation left.  4. Post op shoe dispensed.  5. Return to clinic one week post op.     Felecia ShellingBrent M. Nike Southers, DPM [facility]  Dr. Felecia ShellingBrent M. Kerrick Miler, DPM    [address]                                        [place], [place] [id number]                Office [phone]  Fax [phone]

## 2018-04-30 HISTORY — PX: TOE SURGERY: SHX1073

## 2018-05-06 ENCOUNTER — Other Ambulatory Visit: Payer: Self-pay | Admitting: Allergy and Immunology

## 2018-05-08 ENCOUNTER — Telehealth: Payer: Self-pay | Admitting: Allergy and Immunology

## 2018-05-08 MED ORDER — HYDROXYZINE HCL 25 MG PO TABS: ORAL_TABLET | ORAL | 0 refills | Status: DC

## 2018-05-08 NOTE — Telephone Encounter (Signed)
Pt called and said that the pharmacy has been trying to contact on a refill of Hydroxyzine. Need to call it into [facility] [phone].

## 2018-05-08 NOTE — Telephone Encounter (Signed)
Called patient advised courtesy refill would be given this time due to her needing an appt for f/u patient verbalized understanding rx refill sent in

## 2018-05-10 ENCOUNTER — Telehealth: Payer: Self-pay | Admitting: Podiatry

## 2018-05-10 MED ORDER — AMOXICILLIN-POT CLAVULANATE 875-125 MG PO TABS: 1.0000 | ORAL_TABLET | Freq: Two times a day (BID) | ORAL | 0 refills | Status: DC

## 2018-05-10 NOTE — Telephone Encounter (Signed)
Pt needs another round of antibiotics for infection in left foot. Please give her a call

## 2018-05-10 NOTE — Addendum Note (Signed)
Addended by: Alphia Kava D on: 05/10/2018 03:57 PM   Modules accepted: Orders

## 2018-05-10 NOTE — Telephone Encounter (Signed)
Pt has continued worsening drainage and redness with swelling. I offered pt an appt and she stated Dr. Logan Bores had told her to call for an antibiotic, she has a festival and is unable to come in. I asked pt if she had a fever and she states she has not run a fever the whole time she has had the infection. I told pt is felt she would benefit from being evaluated by Dr. Logan Bores, I would see if he wanted to refill the antibiotic.

## 2018-05-10 NOTE — Telephone Encounter (Signed)
I informed pt Dr. Logan Bores would order another antibiotic. Dr. Logan Bores ordered Augmentin 875-125mg  #20 one tablet bid.

## 2018-05-11 ENCOUNTER — Telehealth: Payer: Self-pay | Admitting: *Deleted

## 2018-05-11 NOTE — Telephone Encounter (Signed)
I got an email from Domingo MendBarbara Dowdy, Charity fundraiserN at [facility].  She has requested wound culture results for Megan BruinsSheila Lynn.  [name]'s surgery is scheduled for 05/18/2018.    I am calling from Dr. Logan BoresEvans' office.  I received a call from the surgical center.  They said you informed them that you have drainage from your wound.  Since you have drainage that are asking for wound culture results to determine if you have MRSA or other specific results.  If you have that, you will not be able to have surgery there.  [facility] need to [facility].  "I just had that done recently at Dr. [facility]om's.  It came back negative."  Can you call Dr. Tasia Catchingsom's office and get them to fax us the results?  "Sure, I'd be glad to do that."  Let me give you my fax number.  It is [phone].

## 2018-05-12 NOTE — Telephone Encounter (Signed)
I called and informed the patient that I have not received her lab results yet from Dr. Tasia Catchingsom's office.  She said she would call them back again.

## 2018-05-15 NOTE — Telephone Encounter (Signed)
I called Dr. Verta Ellenomaszewski's office and spoke to [place] and Nicole.  They both tried to fax me the requested lab results.  Both faxes sent were not readable.  I called and spoke to Cornelius MorasOwen again and he said the only other option was to have the patient pick the results up.   I left the patient a message to call me back.

## 2018-05-16 ENCOUNTER — Telehealth: Payer: Self-pay | Admitting: Podiatry

## 2018-05-16 NOTE — Telephone Encounter (Signed)
I left the patient a message that we have sent someone over to Dr. Verta Ellenomaszewski's office to get the results.  She will take the note over to the surgical center.  I asked her to call is she has any more questions or concerns.  I explained to her about why the surgical center can only accept written results rather than verbal results.  I told her they have to take precautions to prevent others from getting sick or having complications.

## 2018-05-16 NOTE — Telephone Encounter (Signed)
Megan Lynn went and retrieved the lab results from Dr. Tasia Catchingsom[facility] office and took them to [facility].

## 2018-05-16 NOTE — Telephone Encounter (Signed)
I left Ms. Michiel SitesHoyer a message that she needs to go to Dr. Tasia Catchingsom's office to pick up the lab results.  I informed her that they faxed the results to us but we could not read it.  I asked her to get the forms to us so we can get it to the facility.

## 2018-05-16 NOTE — Telephone Encounter (Signed)
I've had two calls today asking about records for labs that prove I do not have MRSA so I can have surgery on Thursday. I talked with that doctors office again, I talked directly to Dr. Elijah Lynn [facility]. He is working on getting it over there. I do not have the ability to pick up the records and bring them over today physically before 5 pm, or do I have that ability tomorrow because I have to work in order to take time off to have surgery. If you talk directly to the doctor, he has the records in front of him and he can confirm that I do not have MRSA and he is working on getting a copy over to you. My number is [phone] but hopefully they will have gotten that to you before you need to call me back. Thank you so much.

## 2018-05-18 ENCOUNTER — Telehealth: Payer: Self-pay | Admitting: *Deleted

## 2018-05-18 ENCOUNTER — Encounter: Payer: Self-pay | Admitting: Podiatry

## 2018-05-18 ENCOUNTER — Other Ambulatory Visit: Payer: Self-pay | Admitting: Podiatry

## 2018-05-18 DIAGNOSIS — M86672 Other chronic osteomyelitis, left ankle and foot: Secondary | ICD-10-CM

## 2018-05-18 MED ORDER — OXYCODONE-ACETAMINOPHEN 5-325 MG PO TABS: 1.0000 | ORAL_TABLET | ORAL | 0 refills | Status: DC | PRN

## 2018-05-18 MED ORDER — LEVOFLOXACIN 750 MG PO TABS: 750.0000 mg | ORAL_TABLET | Freq: Every day | ORAL | 0 refills | Status: DC

## 2018-05-18 NOTE — Telephone Encounter (Signed)
"  There was issues with getting information from my doctor to the surgery center about my lab tests.  I just want to check in and make sure that has been cared for.  I understand from the doctor's office is that is that it has been.  I just want to double check.  Also as requested by Gala LewandowskyBrent Evans, I got my A1c checked and I wanted to let them know the results of that.  It is down but it's still high because I found out about it two months ago that it was high.  So I've only had two months to get it down.  It's gone from 11.3 to 8.5.  So I wanted to relay that information too.  If you have any questions, feel free to call me."

## 2018-05-18 NOTE — Progress Notes (Signed)
Post op Percocet 5/325mg  #30 q4h prn pain  Refill Levaquin 750mg  daily #14

## 2018-05-24 ENCOUNTER — Ambulatory Visit (INDEPENDENT_AMBULATORY_CARE_PROVIDER_SITE_OTHER): Payer: BLUE CROSS/BLUE SHIELD

## 2018-05-24 ENCOUNTER — Ambulatory Visit (INDEPENDENT_AMBULATORY_CARE_PROVIDER_SITE_OTHER): Payer: BLUE CROSS/BLUE SHIELD | Admitting: Podiatry

## 2018-05-24 VITALS — BP 116/87 | HR 95 | Temp 98.7°F

## 2018-05-24 DIAGNOSIS — M869 Osteomyelitis, unspecified: Secondary | ICD-10-CM

## 2018-05-24 DIAGNOSIS — Z9889 Other specified postprocedural states: Secondary | ICD-10-CM

## 2018-05-24 NOTE — Progress Notes (Signed)
   Subjective:  Patient presents today status post partial toe amputation of the left 2nd toe. DOS: 05/18/18. She states she is doing well. She denies any pain or modifying factors. She has been using the post op shoe as well as taking Levaquin as directed. Patient is here for further evaluation and treatment.    Past Medical History:  Diagnosis Date  . Asthma   . Food allergy       Objective/Physical Exam Neurovascular status intact.  Skin incisions appear to be well coapted with sutures and staples intact. No sign of infectious process noted. No dehiscence. No active bleeding noted. Moderate edema noted to the surgical extremity.  Radiographic Exam:  Normal osseous mineralization. Joint spaces preserved. No fracture/dislocation/boney destruction. Absence of distal phalanx of the surgical toe noted.   Assessment: 1. s/p partial toe amputation 2nd digit left foot. DOS: 05/18/18   Plan of Care:  1. Patient was evaluated. X-rays reviewed 2. Dressing changed.  3. Continue weightbearing in post op shoe.  4. Continue taking oral Levaquin until prescription is complete.  5. Return to clinic in one week.    Felecia Shelling, DPM [facility]  Dr. Felecia Shelling, DPM    [address]                                        [place], [place] [address]                Office [phone]  Fax [phone]

## 2018-06-01 ENCOUNTER — Encounter: Payer: Self-pay | Admitting: *Deleted

## 2018-06-05 ENCOUNTER — Ambulatory Visit (INDEPENDENT_AMBULATORY_CARE_PROVIDER_SITE_OTHER): Payer: BLUE CROSS/BLUE SHIELD | Admitting: Podiatry

## 2018-06-05 DIAGNOSIS — Z9889 Other specified postprocedural states: Secondary | ICD-10-CM

## 2018-06-05 DIAGNOSIS — M869 Osteomyelitis, unspecified: Secondary | ICD-10-CM

## 2018-06-06 NOTE — Progress Notes (Signed)
   Subjective:  Patient presents today status post partial toe amputation of the left 2nd toe. DOS: 05/18/18. She states she is doing well. She denies any significant pain or modifying factors. She has been using the post op shoe as directed. Patient is here for further evaluation and treatment.    Past Medical History:  Diagnosis Date  . Asthma   . Food allergy       Objective/Physical Exam Neurovascular status intact.  Skin incisions appear to be well coapted with sutures and staples intact. No sign of infectious process noted. No dehiscence. No active bleeding noted. Moderate edema noted to the surgical extremity.  Assessment: 1. s/p partial toe amputation 2nd digit left foot. DOS: 05/18/18 - healed    Plan of Care:  1. Patient was evaluated.  2. Sutures removed.  3. May resume full activity with no restrictions.  4. Recommended good shoe gear.  5. Return to clinic as needed.    Felecia Shelling, DPM [facility]  Dr. Felecia Shelling, DPM    [address]                                        [place], [place] [address]                Office [phone]  Fax [phone]

## 2018-06-07 NOTE — Progress Notes (Signed)
DOS: 05-18-2018 Partial toe amputation 2nd LT  Dr. Logan Bores  [facility]

## 2018-06-09 ENCOUNTER — Other Ambulatory Visit: Payer: Self-pay | Admitting: Allergy and Immunology

## 2018-07-09 ENCOUNTER — Other Ambulatory Visit: Payer: Self-pay | Admitting: Allergy and Immunology

## 2018-07-17 ENCOUNTER — Ambulatory Visit: Payer: BLUE CROSS/BLUE SHIELD | Admitting: Allergy and Immunology

## 2018-07-17 ENCOUNTER — Encounter: Payer: Self-pay | Admitting: Allergy and Immunology

## 2018-07-17 VITALS — BP 122/82 | HR 92 | Resp 16

## 2018-07-17 DIAGNOSIS — J4530 Mild persistent asthma, uncomplicated: Secondary | ICD-10-CM

## 2018-07-17 DIAGNOSIS — L2089 Other atopic dermatitis: Secondary | ICD-10-CM

## 2018-07-17 DIAGNOSIS — H1045 Other chronic allergic conjunctivitis: Secondary | ICD-10-CM | POA: Diagnosis not present

## 2018-07-17 DIAGNOSIS — J3089 Other allergic rhinitis: Secondary | ICD-10-CM

## 2018-07-17 MED ORDER — LOTEPREDNOL ETABONATE 0.5 % OP SUSP: OPHTHALMIC | 0 refills | Status: DC

## 2018-07-17 MED ORDER — AUVI-Q 0.3 MG/0.3ML IJ SOAJ: INTRAMUSCULAR | 1 refills | Status: DC

## 2018-07-17 MED ORDER — MOMETASONE FUROATE 0.1 % EX CREA: TOPICAL_CREAM | CUTANEOUS | 3 refills | Status: DC

## 2018-07-17 MED ORDER — HYDROXYZINE HCL 25 MG PO TABS: ORAL_TABLET | ORAL | 5 refills | Status: DC

## 2018-07-17 MED ORDER — ALBUTEROL SULFATE HFA 108 (90 BASE) MCG/ACT IN AERS: INHALATION_SPRAY | RESPIRATORY_TRACT | 1 refills | Status: DC

## 2018-07-17 NOTE — Patient Instructions (Addendum)
  1.  Continue 'action plan' for asthma flare up including QVAR 80 REDIHALER 3 inhalations three times per day  2. Continue Flonase 1-2 sprays each nostril one time per day  3. Continue Mometasone 0.1% cream applied to skin 1-7 times per week depending on eczema activity (200gms)  4. Continue antihistamine, Hydroxyzine 25 mg 2 times per day, if needed  5. Continue ProAir HFA if needed  6. Use generic Lotemax drop each eye twice a day for up to 5 days if needed  6. Continue Dupilumab injections and EpiPen / Auvi-Q  7. Return to clinic in 6 months or earlier if needed

## 2018-07-17 NOTE — Progress Notes (Signed)
Follow-up Note  Referring Provider: Jerl MinaHedrick, James, MD Primary Provider: Jerl MinaHedrick, James, MD Date of Office Visit: 07/17/2018  Subjective:   Megan SellsSheila Dawnell Lynn (DOB: 1970/11/21) is a 47 y.o. female who [facility] in re-evaluation of the following:  HPI: Megan HatchetSheila returns to this clinic in reevaluation of her severe atopic dermatitis treated with dupilumab, history of asthma and allergic rhinitis, and history of shellfish allergy.  Her last visit to this clinic was 15 November 2017.  Her atopic dermatitis is under very good control at this point in time.  She still occasionally has outbreaks involving her arms for which she will use a topical mometasone intermittently.  Her use of topical mometasone is about every 2 weeks or so.  She has not had any issues with asthma and has not had to activate an action plan and rarely uses a short acting bronchodilator and has not required a systemic steroid or antibiotic to treat any type of respiratory tract issue.  She continues on Flonase for her nose which appears to be under pretty good control although she does have some seasonal exacerbation of nasal congestion and sneezing.  She does continue on antihistamines on a pretty regular basis.  Her eye disease flares up occasionally.  She has had a trabeculectomy in the past and her ophthalmologist gives her topical steroids intermittently.  Her use of topical steroids is about once every month or once every 2 months for about 2 to 3 days per episode whenever her eyes flareup.  However, she can no longer afford the medications that have been given by her ophthalmologist.  She did receive the flu vaccine this year.  She is now dealing with hyperglycemia and has a plan of therapy that is presently being implemented.  Allergies as of 07/17/2018      Reactions   Erythromycin Shortness Of Breath, Rash   Shellfish Allergy Shortness Of Breath, Rash   Sulfa  Antibiotics Shortness Of Breath, Rash, Anaphylaxis   Doxycycline Nausea Only, Other (See Comments)   Horrible heart burn      Medication List      albuterol 108 (90 Base) MCG/ACT inhaler Commonly known as:  PROVENTIL HFA;VENTOLIN HFA Inhale 2 puffs into the lungs every 6 (six) hours as needed for wheezing or shortness of breath.   DUPIXENT 300 MG/2ML Sosy Generic drug:  Dupilumab INJECT 300 MG SUBCUTANEOUSLY EVERY OTHER WEEK   fluticasone 50 MCG/ACT nasal spray Commonly known as:  FLONASE Place 1 spray into both nostrils 2 (two) times daily.   hydrOXYzine 25 MG tablet Commonly known as:  ATARAX/VISTARIL One tablet twice a day if needed   metFORMIN 500 MG 24 hr tablet Commonly known as:  GLUCOPHAGE-XR TAKE 1 TABLET ONCE DAILY FOR A WEEK THEN TAKE 2 TABLETS DAILY   mometasone 0.1 % cream Commonly known as:  ELOCON Apply 1 application topically daily.   ZYRTEC ALLERGY 10 MG tablet Generic drug:  cetirizine Take 10 mg by mouth daily.       Past Medical History:  Diagnosis Date  . Asthma   . Diabetes (HCC)   . Eczema   . Food allergy     Past Surgical History:  Procedure Laterality Date  . GLAUCOMA SURGERY    . TOE SURGERY Left 04/2018  . TYMPANOSTOMY TUBE PLACEMENT      Review of systems negative except as noted in HPI / PMHx or noted below:  Review of Systems  Constitutional:  Negative.   HENT: Negative.   Eyes: Negative.   Respiratory: Negative.   Cardiovascular: Negative.   Gastrointestinal: Negative.   Genitourinary: Negative.   Musculoskeletal: Negative.   Skin: Negative.   Neurological: Negative.   Endo/Heme/Allergies: Negative.   Psychiatric/Behavioral: Negative.      Objective:   Vitals:   07/17/18 1134  BP: 122/82  Pulse: 92  Resp: 16          Physical Exam  HENT:  Head: Normocephalic.  Right Ear: Tympanic membrane, external ear and ear canal normal.  Left Ear: Tympanic membrane, external ear and ear canal normal.  Nose: Nose  normal. No mucosal edema or rhinorrhea.  Mouth/Throat: Uvula is midline, oropharynx is clear and moist and mucous membranes are normal. No oropharyngeal exudate.  Eyes: Conjunctivae are normal.  Neck: Trachea normal. No tracheal tenderness present. No tracheal deviation present. No thyromegaly present.  Cardiovascular: Normal rate, regular rhythm, S1 normal, S2 normal and normal heart sounds.  No murmur heard. Pulmonary/Chest: Breath sounds normal. No stridor. No respiratory distress. She has no wheezes. She has no rales.  Musculoskeletal: She exhibits no edema.  Lymphadenopathy:       Head (right side): No tonsillar adenopathy present.       Head (left side): No tonsillar adenopathy present.    She has no cervical adenopathy.  Neurological: She is alert.  Skin: Rash (Slightly erythematous patches forearm) noted. She is not diaphoretic. No erythema. Nails show no clubbing.    Diagnostics:    Spirometry was performed and demonstrated an FEV1 of 2.29 at 68 % of predicted.  The patient had an Asthma Control Test with the following results: ACT Total Score: 25.    Assessment and Plan:   1. Other atopic dermatitis   2. Asthma, well controlled, mild persistent   3. Other allergic rhinitis   4. Other chronic allergic conjunctivitis of both eyes     1.  Continue 'action plan' for asthma flare up including QVAR 80 REDIHALER 3 inhalations three times per day  2. Continue Flonase 1-2 sprays each nostril one time per day  3. Continue Mometasone 0.1% cream applied to skin 1-7 times per week depending on eczema activity (200gms)  4. Continue antihistamine, Hydroxyzine 25 mg 2 times per day, if needed  5. Continue ProAir HFA if needed  6. Use generic Lotemax each eye twice a day for up to 5 days if needed  6. Continue Dupilumab injections and EpiPen / Auvi-Q  7. Return to clinic in 6 months or earlier if needed  Overall Megan Lynn appears to be doing relatively well on her current  treatment which includes dupilumab.  Certainly this has resulted in dramatic improvement regarding her skin condition and has basically eliminated most of her other atopic issue.  She still appears to have active inflammation of her eyes on occasion and will give her Lotemax to be used if needed.  Unfortunately her insurance company will not pay for low potency topical steroids without a huge co-pay and she cannot afford these eyedrops.  We will have her use these medications sparingly.  Fortunately, she intermittently and rarely uses topical steroids for her eyes.  I will see her back in this clinic in 6 months or earlier if there is a problem.  Laurette Schimke, MD Allergy / Immunology [facility]

## 2018-07-18 ENCOUNTER — Encounter: Payer: Self-pay | Admitting: Allergy and Immunology

## 2018-07-31 DIAGNOSIS — M79676 Pain in unspecified toe(s): Secondary | ICD-10-CM

## 2018-08-22 ENCOUNTER — Other Ambulatory Visit: Payer: Self-pay | Admitting: Allergy and Immunology

## 2018-08-30 HISTORY — PX: TYMPANOSTOMY TUBE PLACEMENT: SHX32

## 2018-09-12 ENCOUNTER — Other Ambulatory Visit: Payer: Self-pay | Admitting: Podiatry

## 2018-09-12 NOTE — Telephone Encounter (Signed)
Left message informing pt, our office had received a refill request for levaquin, and we had not filled that since 04/2018, and had not been seen in office since 05/2018, to call me to discuss or our appt line to be seen if she was having a problem.

## 2018-10-12 NOTE — Telephone Encounter (Signed)
Error

## 2018-10-14 ENCOUNTER — Other Ambulatory Visit: Payer: Self-pay | Admitting: Allergy and Immunology

## 2018-12-02 ENCOUNTER — Other Ambulatory Visit: Payer: Self-pay | Admitting: Allergy and Immunology

## 2018-12-05 ENCOUNTER — Other Ambulatory Visit: Payer: Self-pay | Admitting: Allergy and Immunology

## 2018-12-14 ENCOUNTER — Other Ambulatory Visit: Payer: Self-pay | Admitting: *Deleted

## 2018-12-14 MED ORDER — DUPILUMAB 300 MG/2ML ~~LOC~~ SOSY: 300.0000 mg | PREFILLED_SYRINGE | SUBCUTANEOUS | 10 refills | Status: DC

## 2019-01-15 ENCOUNTER — Ambulatory Visit: Payer: BLUE CROSS/BLUE SHIELD | Admitting: Allergy and Immunology

## 2019-06-25 ENCOUNTER — Telehealth: Payer: Self-pay | Admitting: *Deleted

## 2019-06-25 NOTE — Telephone Encounter (Signed)
L/M for patient to contact clinic for MD appt so we can obtain Ins approval for Dupixent .  Approval exp 07/18/19

## 2019-06-27 ENCOUNTER — Other Ambulatory Visit: Payer: Self-pay | Admitting: Allergy and Immunology

## 2019-06-28 ENCOUNTER — Ambulatory Visit: Payer: BLUE CROSS/BLUE SHIELD | Admitting: Allergy and Immunology

## 2019-07-02 ENCOUNTER — Encounter: Payer: Self-pay | Admitting: Allergy and Immunology

## 2019-07-02 ENCOUNTER — Ambulatory Visit (INDEPENDENT_AMBULATORY_CARE_PROVIDER_SITE_OTHER): Payer: BC Managed Care – PPO | Admitting: Allergy and Immunology

## 2019-07-02 ENCOUNTER — Other Ambulatory Visit: Payer: Self-pay

## 2019-07-02 VITALS — BP 140/88 | HR 110 | Temp 97.6°F | Resp 18 | Ht 69.0 in | Wt 292.6 lb

## 2019-07-02 DIAGNOSIS — J3089 Other allergic rhinitis: Secondary | ICD-10-CM

## 2019-07-02 DIAGNOSIS — L2089 Other atopic dermatitis: Secondary | ICD-10-CM

## 2019-07-02 DIAGNOSIS — L089 Local infection of the skin and subcutaneous tissue, unspecified: Secondary | ICD-10-CM | POA: Diagnosis not present

## 2019-07-02 DIAGNOSIS — J4530 Mild persistent asthma, uncomplicated: Secondary | ICD-10-CM

## 2019-07-02 NOTE — Patient Instructions (Addendum)
  1.  Continue 'action plan' for asthma flare up including QVAR 80 REDIHALER 3 inhalations three times per day  2. Continue Flonase 1-2 sprays each nostril one time per day  3. Continue Mometasone 0.1% cream applied to skin 1-7 times per week depending on eczema activity (200gms)  4. Continue Hydroxyzine 25 mg 2 times per day, if needed  5. Continue ProAir HFA if needed  6.  Continue eye care directed by ophthalmologist  7. Continue Dupilumab injections and Auvi-Q  8.  Apply Bactroban to right ear 3 times a day for 10 days.  Further treatment?  9. Return to clinic in 6 months or earlier if needed  10.  Obtain Covid vaccine when available

## 2019-07-02 NOTE — Progress Notes (Signed)
[place]    Follow-up Note  Referring Provider: Jerl MinaHedrick, James, MD Primary Provider: Jerl MinaHedrick, James, MD Date of Office Visit: 07/02/2019  Subjective:   Megan SellsSheila Dawnell Lynn (DOB: May 09, 1971) is a 48 y.o. female who [facility] 07[facility] in re-evaluation of the following:  HPI: Megan HatchetSheila returns to this clinic in evaluation of her multiorgan atopic disease including atopic dermatitis, asthma, allergic rhinitis, and history of shellfish allergy.  Her last visit to this clinic was 17 July 2018.  Ever since she started dupilumab her atopic disease has really come under very good control.  Although she still needs to use a topical steroid for the palms of her hands she rarely uses it on other parts of her body.  She rarely activates an action plan and has rare requirement to use a short acting bronchodilator.  Her nose does quite well while doing Flonase.  She has not required a systemic steroid or an antibiotic to treat her respiratory or cutaneous issue since her last visit.  She is undergoing further evaluation and treatment for her eye disease with an upcoming trabeculectomy.  Several days ago she nicked her right ear with a earring and then she placed bacitracin on that area because it became somewhat red and it has ballooned up over the course of the past 3 days.  It is not painful but it is red and a little bit swollen.  Allergies as of 07/02/2019      Reactions   Erythromycin Shortness Of Breath, Rash   Shellfish Allergy Shortness Of Breath, Rash   Sulfa Antibiotics Shortness Of Breath, Rash, Anaphylaxis   Doxycycline Nausea Only, Other (See Comments)   Horrible heart burn      Medication List    albuterol 108 (90 Base) MCG/ACT inhaler Commonly known as: ProAir HFA INHALE 2 PUFFS BY MOUTH EVERY 6 HOURS AS NEEDED FOR WHEEZING OR SHORTNESS OF BREATH   Auvi-Q 0.3 mg/0.3 mL Soaj injection  Generic drug: EPINEPHrine Use as directed for life-threatening allergic reaction.   dupilumab 300 MG/2ML prefilled syringe Commonly known as: Dupixent Inject 300 mg into the skin every 14 (fourteen) days.   fluticasone 50 MCG/ACT nasal spray Commonly known as: FLONASE Place 1 spray into both nostrils 2 (two) times daily.   hydrOXYzine 25 MG tablet Commonly known as: ATARAX/VISTARIL TAKE 1 TABLET BY MOUTH TWICE DAILY IF NEEDED   metFORMIN 500 MG 24 hr tablet Commonly known as: GLUCOPHAGE-XR TAKE 1 TABLET ONCE DAILY FOR A WEEK THEN TAKE 2 TABLETS DAILY   mometasone 0.1 % cream Commonly known as: Elocon Can apply to eczema once daily as directed.   ZyrTEC Allergy 10 MG tablet Generic drug: cetirizine Take 10 mg by mouth daily.       Past Medical History:  Diagnosis Date  . Asthma   . Diabetes (HCC)   . Eczema   . Food allergy     Past Surgical History:  Procedure Laterality Date  . GLAUCOMA SURGERY    . TOE SURGERY Left 04/2018  . TYMPANOSTOMY TUBE PLACEMENT      Review of systems negative except as noted in HPI / PMHx or noted below:  Review of Systems  Constitutional: Negative.   HENT: Negative.   Eyes: Negative.   Respiratory: Negative.   Cardiovascular: Negative.   Gastrointestinal: Negative.   Genitourinary: Negative.   Musculoskeletal: Negative.   Skin: Negative.   Neurological: Negative.   Endo/Heme/Allergies:  Negative.   Psychiatric/Behavioral: Negative.      Objective:   Vitals:   07/02/19 1541  BP: 140/88  Pulse: (!) 110  Resp: 18  Temp: 97.6 F (36.4 C)  SpO2: 95%   Height: 5\' 9"  (175.3 cm)  Weight: 292 lb 9.6 oz (132.7 kg)   Physical Exam Constitutional:      Appearance: She is not diaphoretic.  HENT:     Head: Normocephalic.     Right Ear: Tympanic membrane, ear canal and external ear normal.     Left Ear: Tympanic membrane, ear canal and external ear normal.     Nose: Nose normal. No mucosal edema or rhinorrhea.      Mouth/Throat:     Pharynx: Uvula midline. No oropharyngeal exudate.  Eyes:     Conjunctiva/sclera: Conjunctivae normal.  Neck:     Thyroid: No thyromegaly.     Trachea: Trachea normal. No tracheal tenderness or tracheal deviation.  Cardiovascular:     Rate and Rhythm: Normal rate and regular rhythm.     Heart sounds: Normal heart sounds, S1 normal and S2 normal. No murmur.  Pulmonary:     Effort: No respiratory distress.     Breath sounds: Normal breath sounds. No stridor. No wheezing or rales.  Lymphadenopathy:     Head:     Right side of head: No tonsillar adenopathy.     Left side of head: No tonsillar adenopathy.     Cervical: No cervical adenopathy.  Skin:    Findings: Rash (Right earlobe with erythema, desquamation, no induration or pain to palpation) present. No erythema.     Nails: There is no clubbing.   Neurological:     Mental Status: She is alert.     Diagnostics:    Spirometry was performed and demonstrated an FEV1 of 2.21 at 66 % of predicted.  Assessment and Plan:   1. Other atopic dermatitis   2. Asthma, well controlled, mild persistent   3. Other allergic rhinitis   4. Skin infection     1.  Continue 'action plan' for asthma flare up including QVAR 80 REDIHALER 3 inhalations three times per day  2. Continue Flonase 1-2 sprays each nostril one time per day  3. Continue Mometasone 0.1% cream applied to skin 1-7 times per week depending on eczema activity (200gms)  4. Continue Hydroxyzine 25 mg 2 times per day, if needed  5. Continue ProAir HFA if needed  6.  Continue eye care directed by ophthalmologist  7. Continue Dupilumab injections and Auvi-Q  8.  Apply Bactroban to right ear 3 times a day for 10 days.  Further treatment?  9. Return to clinic in 6 months or earlier if needed  10.  Obtain Covid vaccine when available  Darcia appears to be doing pretty well on her dupilumab therapy as this has obviously resulted in dramatic improvement  regarding all of her atopic disease involving her skin and respiratory tract.  We will continue her on this medication as well as a collection of other agents should they be required as noted above.  I am going to have her discontinue her bacitracin on her right ear as she may be developing a contact dermatitis to this agent and we will treat her with Bactroban for the few days and we will see what type of response we get prior to proceeding with further evaluation and treatment.  If she does well I will see her back in this clinic in 6 months or  earlier if there if needed.  Allena Katz, MD Allergy / Immunology [place]

## 2019-07-03 ENCOUNTER — Encounter: Payer: Self-pay | Admitting: Allergy and Immunology

## 2019-07-03 ENCOUNTER — Other Ambulatory Visit: Payer: Self-pay

## 2019-07-03 MED ORDER — MUPIROCIN 2 % EX OINT: TOPICAL_OINTMENT | CUTANEOUS | 0 refills | Status: DC

## 2019-07-06 ENCOUNTER — Telehealth: Payer: Self-pay | Admitting: *Deleted

## 2019-07-06 NOTE — Telephone Encounter (Signed)
Aware that you are out of the office until Monday, please advise.

## 2019-07-06 NOTE — Telephone Encounter (Signed)
Patient called stating that the infection on her ear is not getting any better with the topical antibiotic. She is wondering if we can call in the oral antibiotics that we talked about during her appointment on Monday. Pharmacy is  [facility] on [address] in [place]

## 2019-07-09 MED ORDER — AMOXICILLIN-POT CLAVULANATE 875-125 MG PO TABS: ORAL_TABLET | ORAL | 0 refills | Status: DC

## 2019-07-09 NOTE — Telephone Encounter (Signed)
Left a message on patient's voicemail informing her that an antibiotic had been sent in to her pharmacy. Sent in Augmentin 875 twice daily for 10 days.

## 2019-07-09 NOTE — Telephone Encounter (Signed)
Please have her use Augmentin 875 1 tablet twice a day for the next 10 days.

## 2019-07-10 NOTE — Telephone Encounter (Signed)
Left message on machine.

## 2019-07-10 NOTE — Telephone Encounter (Signed)
Can she tolerate Ceftin 250 - 1 tablet twice a day for 10 days?

## 2019-07-10 NOTE — Telephone Encounter (Signed)
Megan Lynn does not tolerate Augmentin, can we call in an alternative? She also would like Diflucan as well. Please advise.

## 2019-07-12 NOTE — Telephone Encounter (Signed)
Left message to call the office.

## 2019-07-18 ENCOUNTER — Other Ambulatory Visit: Payer: Self-pay | Admitting: Allergy and Immunology

## 2019-07-18 NOTE — Telephone Encounter (Signed)
Left another message to call the office

## 2019-07-18 NOTE — Telephone Encounter (Signed)
PT called back says she had eye surgery so she has not been near her phone. Also states ear infection has cleared up. Does not need a cb.

## 2019-07-19 NOTE — Telephone Encounter (Signed)
See message below °

## 2019-10-30 ENCOUNTER — Other Ambulatory Visit: Payer: Self-pay | Admitting: Allergy and Immunology

## 2019-11-05 ENCOUNTER — Ambulatory Visit: Payer: No Typology Code available for payment source | Attending: Internal Medicine

## 2019-11-05 DIAGNOSIS — Z23 Encounter for immunization: Secondary | ICD-10-CM | POA: Insufficient documentation

## 2019-11-05 NOTE — Progress Notes (Addendum)
   Covid-19 Vaccination Clinic  Name:  Megan Lynn    MRN: [id number] DOB: [birth year]  11/05/2019  [name] was observed post Covid-19 immunization for 30 minutes 3 without incident. She was provided with Vaccine Information Sheet and instruction to access the V-Safe system.   [name] was instructed to call 911 with any severe reactions post vaccine: Marland Kitchen Difficulty breathing  . Swelling of face and throat  . A fast heartbeat  . A bad rash all over body  . Dizziness and weakness   Immunizations Administered    Name Date Dose VIS Date Route   Pfizer COVID-19 Vaccine 11/05/2019  5:48 PM 0.3 mL 08/10/2019 Intramuscular   Manufacturer: [employer], [employer]   Lot: [id number]   NDC: [address]-2

## 2019-11-11 ENCOUNTER — Other Ambulatory Visit: Payer: Self-pay | Admitting: Allergy and Immunology

## 2019-11-26 ENCOUNTER — Ambulatory Visit: Payer: Self-pay | Attending: Internal Medicine

## 2019-11-26 DIAGNOSIS — Z23 Encounter for immunization: Secondary | ICD-10-CM

## 2019-11-26 NOTE — Progress Notes (Signed)
   Covid-19 Vaccination Clinic  Name:  Megan Lynn    MRN: [id number] DOB: 1971/06/24  11/26/2019  [name] was observed post Covid-19 immunization for 15 minutes without incident. She was provided with Vaccine Information Sheet and instruction to access the V-Safe system.   [name] was instructed to call 911 with any severe reactions post vaccine: Marland Kitchen Difficulty breathing  . Swelling of face and throat  . A fast heartbeat  . A bad rash all over body  . Dizziness and weakness   Immunizations Administered    Name Date Dose VIS Date Route   Pfizer COVID-19 Vaccine 11/26/2019 11:18 AM 0.3 mL 08/10/2019 Intramuscular   Manufacturer: [employer], [employer]   Lot: [id number]   NDC: [address]-1

## 2020-01-09 ENCOUNTER — Other Ambulatory Visit: Payer: Self-pay | Admitting: Allergy and Immunology

## 2020-02-04 ENCOUNTER — Encounter: Payer: Self-pay | Admitting: Allergy and Immunology

## 2020-02-04 ENCOUNTER — Other Ambulatory Visit: Payer: Self-pay

## 2020-02-04 ENCOUNTER — Ambulatory Visit (INDEPENDENT_AMBULATORY_CARE_PROVIDER_SITE_OTHER): Payer: BC Managed Care – PPO | Admitting: Allergy and Immunology

## 2020-02-04 VITALS — BP 136/88 | HR 106 | Resp 16 | Ht 68.5 in | Wt 282.0 lb

## 2020-02-04 DIAGNOSIS — J4530 Mild persistent asthma, uncomplicated: Secondary | ICD-10-CM | POA: Diagnosis not present

## 2020-02-04 DIAGNOSIS — L2089 Other atopic dermatitis: Secondary | ICD-10-CM | POA: Diagnosis not present

## 2020-02-04 DIAGNOSIS — J3089 Other allergic rhinitis: Secondary | ICD-10-CM | POA: Diagnosis not present

## 2020-02-04 MED ORDER — HYDROXYZINE HCL 25 MG PO TABS: 25.0000 mg | ORAL_TABLET | Freq: Two times a day (BID) | ORAL | 5 refills | Status: DC

## 2020-02-04 MED ORDER — EPICERAM EX EMUL: CUTANEOUS | 5 refills | Status: DC

## 2020-02-04 MED ORDER — PIMECROLIMUS 1 % EX CREA: TOPICAL_CREAM | CUTANEOUS | 3 refills | Status: DC

## 2020-02-04 NOTE — Patient Instructions (Addendum)
  1. Start Elidil applied to inflamed skin 1-2 times a day until resolved.  2. Use OTC Cetaphil cleaner only to wash hands.  Discontinue hand sanitizer  3. Can apply EpiCeram to inflamed skin a few times per day if needed  4. Continue 'action plan' for asthma flare up including QVAR 80 REDIHALER 3 inhalations three times per day  5. Continue Flonase 1-2 sprays each nostril one time per day  6. Continue Mometasone 0.1% cream applied to skin 1-7 times per week if needed  7. Continue Hydroxyzine 25 mg 2 times per day, if needed  8. Continue ProAir HFA if needed  9. Continue Dupilumab injections and Auvi-Q  8. Return to clinic in 6 months or earlier if needed

## 2020-02-04 NOTE — Progress Notes (Signed)
[place]   Follow-up Note  Referring Provider: Maryland Pink, MD Primary Provider: Maryland Pink, MD Date of Office Visit: 02/04/2020  Subjective:   Megan Lynn (DOB: 26-Aug-1971) is a 49 y.o. female who returns to the [facility] on 02/04/2020 in re-evaluation of the following:  HPI: Rosana returns to this clinic in evaluation of asthma, allergic rhinitis, atopic dermatitis treated with dupilumab, and history of shellfish allergy.  Her last visit to this clinic was 02 July 2019.  Her airway has done very well since her last visit without the need for systemic steroid or antibiotic to treat any type of airway issue and a rare use of a short acting bronchodilator and very good control of her upper airway disease as well.  She has lost control of her eczema over the course of the past few months.  She has had to use some hand sanitizer on occasion and this just tears up her hands and then she develops swelling and cracking of her hands.  She has had a little area on her back and behind her posterior thighs as well.  She has placed mometasone on these areas and they do not appear to be responding to this treatment.  She remains away from consuming shellfish.  Allergies as of 02/04/2020      Reactions   Erythromycin Shortness Of Breath, Rash   Shellfish Allergy Shortness Of Breath, Rash   Sulfa Antibiotics Shortness Of Breath, Rash, Anaphylaxis   Doxycycline Nausea Only, Other (See Comments)   Horrible heart burn      Medication List    albuterol 108 (90 Base) MCG/ACT inhaler Commonly known as: ProAir HFA INHALE 2 PUFFS BY MOUTH EVERY 6 HOURS AS NEEDED FOR WHEEZING OR SHORTNESS OF BREATH   Auvi-Q 0.3 mg/0.3 mL Soaj injection Generic drug: EPINEPHrine Use as directed for life-threatening allergic reaction.   Dupixent 300 MG/2ML prefilled syringe Generic drug: dupilumab INJECT 300 MG SUBCUTANEOUSLY EVERY  OTHER WEEK   fluticasone 50 MCG/ACT nasal spray Commonly known as: FLONASE Place 1 spray into both nostrils 2 (two) times daily.   hydrOXYzine 25 MG tablet Commonly known as: ATARAX/VISTARIL TAKE 1 TABLET BY MOUTH TWICE DAILY   metFORMIN 500 MG 24 hr tablet Commonly known as: GLUCOPHAGE-XR TAKE 1 TABLET ONCE DAILY FOR A WEEK THEN TAKE 2 TABLETS DAILY   mometasone 0.1 % cream Commonly known as: ELOCON APPLY TOPICALLY TO AFFECTED ECZEMA AREA ONCE DAILY AS DIRECTED   ZyrTEC Allergy 10 MG tablet Generic drug: cetirizine Take 10 mg by mouth daily.       Past Medical History:  Diagnosis Date  . Asthma   . Diabetes ([place])   . Eczema   . Food allergy     Past Surgical History:  Procedure Laterality Date  . GLAUCOMA SURGERY    . TOE SURGERY Left 04/2018  . TYMPANOSTOMY TUBE PLACEMENT      Review of systems negative except as noted in HPI / PMHx or noted below:  Review of Systems  Constitutional: Negative.   HENT: Negative.   Eyes: Negative.   Respiratory: Negative.   Cardiovascular: Negative.   Gastrointestinal: Negative.   Genitourinary: Negative.   Musculoskeletal: Negative.   Skin: Negative.   Neurological: Negative.   Endo/Heme/Allergies: Negative.   Psychiatric/Behavioral: Negative.      Objective:   Vitals:   02/04/20 1349  BP: 136/88  Pulse: (!) 106  Resp: 16  SpO2: 96%  Height: 5' 8.5" (174 cm)  Weight: 282 lb (127.9 kg)   Physical Exam Constitutional:      Appearance: She is not diaphoretic.  HENT:     Head: Normocephalic.     Right Ear: Tympanic membrane, ear canal and external ear normal.     Left Ear: Tympanic membrane, ear canal and external ear normal.     Nose: Nose normal. No mucosal edema or rhinorrhea.     Mouth/Throat:     Pharynx: Uvula midline. No oropharyngeal exudate.  Eyes:     Conjunctiva/sclera: Conjunctivae normal.  Neck:     Thyroid: No thyromegaly.     Trachea: Trachea normal. No tracheal tenderness or tracheal  deviation.  Cardiovascular:     Rate and Rhythm: Normal rate and regular rhythm.     Heart sounds: Normal heart sounds, S1 normal and S2 normal. No murmur.  Pulmonary:     Effort: No respiratory distress.     Breath sounds: Normal breath sounds. No stridor. No wheezing or rales.  Lymphadenopathy:     Head:     Right side of head: No tonsillar adenopathy.     Left side of head: No tonsillar adenopathy.     Cervical: No cervical adenopathy.  Skin:    Findings: Rash (Erythema, induration, cracking palmar surface of hands.  Patch of red lichenified skin upper back and posterior thighs bilaterally) present. No erythema.     Nails: There is no clubbing.  Neurological:     Mental Status: She is alert.     Diagnostics: none  Assessment and Plan:   1. Other atopic dermatitis   2. Asthma, well controlled, mild persistent   3. Other allergic rhinitis     1. Start Elidil applied to inflamed skin 1-2 times a day until resolved.  2. Use OTC Cetaphil cleaner only to wash hands.  Discontinue hand sanitizer  3. Can apply EpiCeram to inflamed skin a few times per day if needed  4. Continue 'action plan' for asthma flare up including QVAR 80 REDIHALER 3 inhalations three times per day  5. Continue Flonase 1-2 sprays each nostril one time per day  6. Continue Mometasone 0.1% cream applied to skin 1-7 times per week if needed  7. Continue Hydroxyzine 25 mg 2 times per day, if needed  8. Continue ProAir HFA if needed  9. Continue Dupilumab injections and Auvi-Q  Shirline appears to have some inflammation of her skin and we will have her utilize the plan noted above which includes adding Elidel and eliminating all hand sanitizer's and continuing on dupilumab.  She will continue on the other agents required to control her other forms of atopic disease as noted above.  She will keep in contact with me noting her response to this approach.  If she does well I will see her back in this clinic in 6  months or earlier if there is a problem.  Laurette Schimke, MD Allergy / Immunology [facility]

## 2020-02-05 ENCOUNTER — Encounter: Payer: Self-pay | Admitting: Allergy and Immunology

## 2020-02-08 ENCOUNTER — Telehealth: Payer: Self-pay

## 2020-02-08 NOTE — Telephone Encounter (Signed)
Prescription for pimecrolimus 1% cream has been approved and sent to pharmacy as well as scan center.

## 2020-03-05 ENCOUNTER — Telehealth: Payer: Self-pay | Admitting: Allergy and Immunology

## 2020-03-05 MED ORDER — PREDNISONE 10 MG PO TABS: ORAL_TABLET | ORAL | 0 refills | Status: DC

## 2020-03-05 MED ORDER — AMOXICILLIN-POT CLAVULANATE 875-125 MG PO TABS: ORAL_TABLET | ORAL | 0 refills | Status: DC

## 2020-03-05 NOTE — Telephone Encounter (Signed)
Please inform patient that we will treat her with an antibacterial agent using Augmentin 875 1 tablet twice a day for the next 14 days and give her a low-dose of prednisone at 10 mg daily 1 time per day for 14 days.  She will need to watch her blood sugars closely while she is using this low-dose prednisone.  Please continue to use her topical agents.  Please contact us at the end of this therapy.

## 2020-03-05 NOTE — Telephone Encounter (Signed)
Megan Lynn called in and stated that the Elidel and Epiceram are still not working on her hands.  Megan Lynn states her hands are still swelling and now her hands keep crusting and peeling and are very painful.  Megan Lynn states her skin is cracking and water is painful.  She would like to know what the next step is.  Please advise.

## 2020-03-05 NOTE — Telephone Encounter (Signed)
ERX sent to [facility] and patient informed.

## 2020-03-25 ENCOUNTER — Other Ambulatory Visit: Payer: Self-pay | Admitting: Allergy and Immunology

## 2020-03-25 ENCOUNTER — Other Ambulatory Visit: Payer: Self-pay | Admitting: *Deleted

## 2020-03-25 MED ORDER — PROAIR HFA 108 (90 BASE) MCG/ACT IN AERS: INHALATION_SPRAY | RESPIRATORY_TRACT | 1 refills | Status: DC

## 2020-03-28 ENCOUNTER — Other Ambulatory Visit: Payer: Self-pay

## 2020-03-28 MED ORDER — ALBUTEROL SULFATE HFA 108 (90 BASE) MCG/ACT IN AERS: INHALATION_SPRAY | RESPIRATORY_TRACT | 1 refills | Status: DC

## 2020-07-21 ENCOUNTER — Other Ambulatory Visit: Payer: Self-pay | Admitting: Allergy and Immunology

## 2020-08-09 ENCOUNTER — Other Ambulatory Visit: Payer: Self-pay | Admitting: Allergy and Immunology

## 2020-08-18 ENCOUNTER — Other Ambulatory Visit: Payer: Self-pay

## 2020-08-18 ENCOUNTER — Encounter: Payer: Self-pay | Admitting: Allergy and Immunology

## 2020-08-18 ENCOUNTER — Ambulatory Visit: Payer: BC Managed Care – PPO | Admitting: Allergy and Immunology

## 2020-08-18 VITALS — BP 118/72 | HR 100 | Resp 16

## 2020-08-18 DIAGNOSIS — J3089 Other allergic rhinitis: Secondary | ICD-10-CM | POA: Diagnosis not present

## 2020-08-18 DIAGNOSIS — L2089 Other atopic dermatitis: Secondary | ICD-10-CM | POA: Diagnosis not present

## 2020-08-18 DIAGNOSIS — J4530 Mild persistent asthma, uncomplicated: Secondary | ICD-10-CM

## 2020-08-18 DIAGNOSIS — T7800XA Anaphylactic reaction due to unspecified food, initial encounter: Secondary | ICD-10-CM

## 2020-08-18 MED ORDER — HYDROXYZINE HCL 25 MG PO TABS: ORAL_TABLET | ORAL | 1 refills | Status: DC

## 2020-08-18 NOTE — Progress Notes (Signed)
[place] - [place] - [place] - [place] - [place]   Follow-up Note  Referring Provider: [name], MD Primary Provider: [name], MD Date of Office Visit: 08/18/2020  Subjective:   Megan Lynn (DOB: 11-17-1970) is a 49 y.o. female [facility] in re-evaluation of the following:  HPI: Megan Lynn returns to this clinic in evaluation of atopic dermatitis, asthma, allergic rhinitis, and history of shellfish allergy.  Her last visit to this clinic was 04 February 2020.  She did develop a hand flare in July 2021 that required broad-spectrum antibiotic and a very short course of low-dose systemic steroids that did not cause any problems with her hyperglycemia.  This flareup appeared to be precipitated with the use of antibacterial agents.  She resolved that flare.  Once again she is doing wonderful regarding her multiorgan atopic disease while using dupilumab.  Her atopic dermatitis remains under very good control.  Sometimes she can tell on the very last day or 2 of the nadir of dupilumab injections that she is starting to get some activity of her skin but otherwise she intermittently uses Elidel and topical mometasone and epi serum.  She has had no issues with her airway and has not required a systemic steroid or antibiotic for any type of airway issue and rarely uses a short acting bronchodilator.  She has received 3 Pfizer Covid vaccinations and the flu vaccine this year.  She remains away from eating all shellfish.  She is on disability for her vision issue.  Allergies as of 08/18/2020      Reactions   Erythromycin Shortness Of Breath, Rash   Shellfish Allergy Shortness Of Breath, Rash   Sulfa Antibiotics Shortness Of Breath, Rash, Anaphylaxis   Doxycycline Nausea Only, Other (See Comments)   Horrible heart burn      Medication List    albuterol 108 (90 Base) MCG/ACT inhaler Commonly known as: ProAir HFA INHALE  2 PUFFS BY MOUTH EVERY 6 HOURS AS NEEDED FOR WHEEZING OR SHORTNESS OF BREATH   amoxicillin-clavulanate 875-125 MG tablet Commonly known as: AUGMENTIN Take one tablet by mouth twice daily for fourteen days.   Auvi-Q 0.3 mg/0.3 mL Soaj injection Generic drug: EPINEPHrine Use as directed for life-threatening allergic reaction.   cetirizine 10 MG tablet Commonly known as: ZYRTEC Take 10 mg by mouth daily.   Dupixent 300 MG/2ML prefilled syringe Generic drug: dupilumab INJECT 300 MG SUBCUTANEOUSLY EVERY OTHER WEEK   EpiCeram lotion Apply to inflamed skin multiple times per day if needed   fluticasone 50 MCG/ACT nasal spray Commonly known as: FLONASE Place 1 spray into both nostrils 2 (two) times daily.   hydrOXYzine 25 MG tablet Commonly known as: ATARAX/VISTARIL TAKE 1 TABLET(25 MG) BY MOUTH TWICE DAILY   metFORMIN 500 MG 24 hr tablet Commonly known as: GLUCOPHAGE-XR TAKE 1 TABLET ONCE DAILY FOR A WEEK THEN TAKE 2 TABLETS DAILY   mometasone 0.1 % cream Commonly known as: ELOCON APPLY TOPICALLY TO AFFECTED ECZEMA AREA ONCE DAILY AS DIRECTED   pimecrolimus 1 % cream Commonly known as: ELIDEL APPLY TO INFLAMED SKIN 1 TO 2 TIMES A DAY UNTIL RESOLVED       Past Medical History:  Diagnosis Date  . Asthma   . Diabetes (HCC)   . Eczema   . Food allergy     Past Surgical History:  Procedure Laterality Date  . GLAUCOMA SURGERY    . TOE SURGERY Left 04/2018  . TYMPANOSTOMY  TUBE PLACEMENT      Review of systems negative except as noted in HPI / PMHx or noted below:  Review of Systems  Constitutional: Negative.   HENT: Negative.   Eyes: Negative.   Respiratory: Negative.   Cardiovascular: Negative.   Gastrointestinal: Negative.   Genitourinary: Negative.   Musculoskeletal: Negative.   Skin: Negative.   Neurological: Negative.   Endo/Heme/Allergies: Negative.   Psychiatric/Behavioral: Negative.      Objective:   Vitals:   08/18/20 1143  BP: 118/72   Pulse: 100  Resp: 16  SpO2: 94%          Physical Exam Constitutional:      Appearance: She is not diaphoretic.  HENT:     Head: Normocephalic.     Right Ear: Tympanic membrane, ear canal and external ear normal.     Left Ear: Tympanic membrane, ear canal and external ear normal.     Nose: Nose normal. No mucosal edema or rhinorrhea.     Mouth/Throat:     Mouth: Oropharynx is clear and moist and mucous membranes are normal.     Pharynx: Uvula midline. No oropharyngeal exudate.  Eyes:     Conjunctiva/sclera: Conjunctivae normal.  Neck:     Thyroid: No thyromegaly.     Trachea: Trachea normal. No tracheal tenderness or tracheal deviation.  Cardiovascular:     Rate and Rhythm: Normal rate and regular rhythm.     Heart sounds: Normal heart sounds, S1 normal and S2 normal. No murmur heard.   Pulmonary:     Effort: No respiratory distress.     Breath sounds: Normal breath sounds. No stridor. No wheezing or rales.  Musculoskeletal:        General: No edema.  Lymphadenopathy:     Head:     Right side of head: No tonsillar adenopathy.     Left side of head: No tonsillar adenopathy.     Cervical: No cervical adenopathy.  Skin:    Findings: No erythema or rash.     Nails: There is no clubbing.  Neurological:     Mental Status: She is alert.     Diagnostics: none  Assessment and Plan:   1. Other atopic dermatitis   2. Asthma, well controlled, mild persistent   3. Other allergic rhinitis   4. Allergy with anaphylaxis due to food     1. Continue Dupilumab injections  2. If needed:   A. Elidil 1-2 times a day  B. Mometasone 0.1% cream 1-2 times per day  C. EpiCeram to inflamed skin a few times per day  D. Hydroxyzine 25 mg 2 times per day  E. Flonase 1-2 sprays each nostril 1 time per day  F. Auvi-Q 0.3  G. Albuterol HFA   3. Continue 'action plan' for asthma flare up including QVAR 80 REDIHALER 3 inhalations three times per day  4. Return to clinic in 6  months or earlier if needed  Megan Lynn appears to be doing very well and she has a very good understanding of her disease state and how her medications work and she will continue on dupilumab on a consistent basis and utilize a selection of agents as noted above should they be required.  I will see her back in this clinic in 6 months or earlier if there is a problem.  Laurette Schimke, MD Allergy / Immunology [facility]

## 2020-08-18 NOTE — Patient Instructions (Addendum)
  1. Continue Dupilumab injections  2. If needed:   A. Elidil 1-2 times a day  B. Mometasone 0.1% cream 1-2 times per day  C. EpiCeram to inflamed skin a few times per day  D. Hydroxyzine 25 mg 2 times per day  E. Flonase 1-2 sprays each nostril 1 time per day  F. Auvi-Q 0.3  G. Albuterol HFA   3. Continue 'action plan' for asthma flare up including QVAR 80 REDIHALER 3 inhalations three times per day  4. Return to clinic in 6 months or earlier if needed

## 2020-08-19 ENCOUNTER — Encounter: Payer: Self-pay | Admitting: Allergy and Immunology

## 2020-10-28 ENCOUNTER — Other Ambulatory Visit: Payer: Self-pay | Admitting: Allergy and Immunology

## 2020-11-20 ENCOUNTER — Other Ambulatory Visit: Payer: Self-pay | Admitting: Family Medicine

## 2020-11-20 DIAGNOSIS — N63 Unspecified lump in unspecified breast: Secondary | ICD-10-CM

## 2020-12-23 ENCOUNTER — Telehealth: Payer: Self-pay | Admitting: *Deleted

## 2020-12-23 NOTE — Telephone Encounter (Signed)
Patient called and advised now has medicare and unable to afford her Dupixent.  I advised her would need to send in app for PAP for free medication and will fax to [place] to have patient sign when she comes to pick up sample set aside for patient

## 2020-12-24 ENCOUNTER — Telehealth: Payer: Self-pay | Admitting: Allergy and Immunology

## 2020-12-24 NOTE — Telephone Encounter (Signed)
Patient came in to pick up a sample of Dupixent today.

## 2020-12-24 NOTE — Telephone Encounter (Signed)
Pt came in and updated her insurance. Insurance card is now on file [employer] 2022). Pt also dropped off her Benefit Verification Letter, I have faxed it over to Tammy. Pt also asked for Dupixent to be sent to [facility] instead of the [facility] as that is what new insurance will cover.   Please advise.

## 2020-12-24 NOTE — Telephone Encounter (Signed)
Unfortunately, I did not see the form for her to sign until after she left. I called pt & left voicemail for her to come back & sign application.

## 2021-01-16 ENCOUNTER — Telehealth: Payer: Self-pay | Admitting: *Deleted

## 2021-01-16 NOTE — Telephone Encounter (Signed)
Patient called and is in process of trying to get PAP for dupixent and overdue for injection and requesting sample. I contacted [facility] an had them put sample aside for patient and advised she can pick same up

## 2021-01-22 ENCOUNTER — Telehealth: Payer: Self-pay | Admitting: *Deleted

## 2021-01-22 NOTE — Telephone Encounter (Signed)
Patient now has [facility]  And was trying to get her PAP for Dupixent but fax sent today advised did not meet financial. Called [facility] and they advised she has to file for LIS since her credit check showed 3  In household and $30000.00 income then if deinied can get PAP. L/M for patient to contact me to advise same

## 2021-01-27 ENCOUNTER — Other Ambulatory Visit: Payer: Self-pay | Admitting: Allergy and Immunology

## 2021-02-03 NOTE — Telephone Encounter (Signed)
Spoke to patient and advised to check with [facility] about LIS

## 2021-03-07 ENCOUNTER — Other Ambulatory Visit: Payer: Self-pay | Admitting: Allergy and Immunology

## 2021-03-11 ENCOUNTER — Telehealth: Payer: Medicare Other | Admitting: *Deleted

## 2021-03-11 NOTE — Telephone Encounter (Signed)
Patient had left message regarding next step to get PAP for Dupixent. I L/M for patient to fax the denial for LIS extra help to office that I can send to [facility] for PAP approval

## 2021-04-04 ENCOUNTER — Other Ambulatory Visit: Payer: Self-pay | Admitting: Allergy and Immunology

## 2021-04-06 ENCOUNTER — Telehealth: Payer: Self-pay | Admitting: Allergy and Immunology

## 2021-04-06 NOTE — Telephone Encounter (Signed)
Patient states she contacted the pharmacy and they told her the refill for Hydroxyzine was denied. Patient has an upcoming OV on 8/17.

## 2021-04-07 MED ORDER — HYDROXYZINE HCL 25 MG PO TABS: ORAL_TABLET | ORAL | 2 refills | Status: DC

## 2021-04-07 NOTE — Telephone Encounter (Signed)
Refill for hydroxyzine has been sent in to [facility] on [address]. Called patient to inform.

## 2021-04-15 ENCOUNTER — Other Ambulatory Visit: Payer: Self-pay | Admitting: Allergy and Immunology

## 2021-04-15 ENCOUNTER — Other Ambulatory Visit: Payer: Self-pay

## 2021-04-15 ENCOUNTER — Ambulatory Visit: Payer: Medicare Other | Admitting: Allergy and Immunology

## 2021-04-15 ENCOUNTER — Encounter: Payer: Self-pay | Admitting: Allergy and Immunology

## 2021-04-15 VITALS — BP 142/84 | HR 103 | Resp 16 | Ht 68.5 in | Wt 271.8 lb

## 2021-04-15 DIAGNOSIS — J3089 Other allergic rhinitis: Secondary | ICD-10-CM | POA: Diagnosis not present

## 2021-04-15 DIAGNOSIS — J4530 Mild persistent asthma, uncomplicated: Secondary | ICD-10-CM

## 2021-04-15 DIAGNOSIS — L2089 Other atopic dermatitis: Secondary | ICD-10-CM | POA: Diagnosis not present

## 2021-04-15 DIAGNOSIS — T7800XA Anaphylactic reaction due to unspecified food, initial encounter: Secondary | ICD-10-CM

## 2021-04-15 MED ORDER — MOMETASONE FUROATE 0.1 % EX CREA: TOPICAL_CREAM | CUTANEOUS | 5 refills | Status: DC

## 2021-04-15 MED ORDER — HYDROXYZINE HCL 25 MG PO TABS: ORAL_TABLET | ORAL | 1 refills | Status: DC

## 2021-04-15 NOTE — Patient Instructions (Addendum)
  1. Continue Dupilumab injections  2. Can try Opzelura - apply to eczema 1-2 times per day  2. If needed:   A.  Mometasone 0.1% cream 1-2 times per day  C.  EpiCeram   D.  Hydroxyzine 25 mg 2 times per day  E.  Flonase 1-2 sprays each nostril 1 time per day  F.  Auvi-Q 0.3  G. Albuterol HFA   3. Continue 'action plan' for asthma flare up including QVAR 80 REDIHALER 3 inhalations three times per day  4. Return to clinic in 6 months or earlier if needed  5. Obtain fall flu vaccine

## 2021-04-15 NOTE — Progress Notes (Signed)
[place] - [place] - [place] - [place] - [place]   Follow-up Note  Referring Provider: [name], MD Primary Provider: [name], MD Date of Office Visit: 04/15/2021  Subjective:   Megan Lynn (DOB: 1970-12-31) is a 50 y.o. female who [facility] on 04[facility]15/2021 in re-evaluation of the following:  HPI: Megan Lynn returns to this clinic in evaluation of atopic dermatitis, asthma, allergic rhinitis, and history of food allergy directed against shellfish.  Her last visit to this clinic was 18 August 2020.  Dupilumab has resulted in dramatic improvement regarding control of her atopic dermatitis.  But, she still has persistent areas of her body that Flare very commonly including her palmar surface of her hands and her ankles even in the face of using a calcineurin inhibitor and a topical steroid.  Her asthma has been nonexistent and she does not use a short acting bronchodilator and has not had to activate a "action plan" for an asthma flareup and has not required a systemic steroid to treat an exacerbation.  Likewise, her nose has really been doing very well and she has had very little problems with her upper airway and has not required an antibiotic to treat an episode of sinusitis.  She did require an antibiotic to treat a episode of otitis media in June 2022.  She remains away from consumption of shellfish.  Allergies as of 04/15/2021       Reactions   Erythromycin Shortness Of Breath, Rash   Shellfish Allergy Shortness Of Breath, Rash   Sulfa Antibiotics Shortness Of Breath, Rash, Anaphylaxis   Doxycycline Nausea Only, Other (See Comments)   Horrible heart burn        Medication List    albuterol 108 (90 Base) MCG/ACT inhaler Commonly known as: ProAir HFA INHALE 2 PUFFS BY MOUTH EVERY 6 HOURS AS NEEDED FOR WHEEZING OR SHORTNESS OF BREATH   Auvi-Q 0.3 mg/0.3 mL Soaj injection Generic drug: EPINEPHrine Use as directed  for life-threatening allergic reaction.   cetirizine 10 MG tablet Commonly known as: ZYRTEC Take 10 mg by mouth daily.   Dupixent 300 MG/2ML prefilled syringe Generic drug: dupilumab INJECT 300 MG SUBCUTANEOUSLY EVERY OTHER WEEK   EpiCeram lotion Apply to inflamed skin multiple times per day if needed   fluticasone 50 MCG/ACT nasal spray Commonly known as: FLONASE Place 1 spray into both nostrils 2 (two) times daily.   hydrOXYzine 25 MG tablet Commonly known as: ATARAX/VISTARIL Take 1 tablet 2 times per day   metFORMIN 500 MG 24 hr tablet Commonly known as: GLUCOPHAGE-XR TAKE 1 TABLET ONCE DAILY FOR A WEEK THEN TAKE 2 TABLETS DAILY   mometasone 0.1 % cream Commonly known as: ELOCON APPLY TOPICALLY TO AFFECTED ECZEMA AREA ONCE DAILY AS DIRECTED    Past Medical History:  Diagnosis Date   Asthma    Diabetes (HCC)    Eczema    Food allergy     Past Surgical History:  Procedure Laterality Date   GLAUCOMA SURGERY     TOE SURGERY Left 04/2018   TYMPANOSTOMY TUBE PLACEMENT      Review of systems negative except as noted in HPI / PMHx or noted below:  Review of Systems  Constitutional: Negative.   HENT: Negative.    Eyes: Negative.   Respiratory: Negative.    Cardiovascular: Negative.   Gastrointestinal: Negative.   Genitourinary: Negative.   Musculoskeletal: Negative.   Skin: Negative.   Neurological: Negative.   Endo/Heme/Allergies: Negative.  Psychiatric/Behavioral: Negative.      Objective:   Vitals:   04/15/21 1013  BP: (!) 142/84  Pulse: (!) 103  Resp: 16  SpO2: 98%   Height: 5' 8.5" (174 cm)  Weight: 271 lb 12.8 oz (123.3 kg)   Physical Exam Constitutional:      Appearance: She is not diaphoretic.  HENT:     Head: Normocephalic.     Right Ear: Tympanic membrane, ear canal and external ear normal.     Left Ear: Tympanic membrane, ear canal and external ear normal.     Nose: Nose normal. No mucosal edema or rhinorrhea.     Mouth/Throat:      Pharynx: Uvula midline. No oropharyngeal exudate.  Eyes:     Conjunctiva/sclera: Conjunctivae normal.  Neck:     Thyroid: No thyromegaly.     Trachea: Trachea normal. No tracheal tenderness or tracheal deviation.  Cardiovascular:     Rate and Rhythm: Normal rate and regular rhythm.     Heart sounds: Normal heart sounds, S1 normal and S2 normal. No murmur heard. Pulmonary:     Effort: No respiratory distress.     Breath sounds: Normal breath sounds. No stridor. No wheezing or rales.  Lymphadenopathy:     Head:     Right side of head: No tonsillar adenopathy.     Left side of head: No tonsillar adenopathy.     Cervical: No cervical adenopathy.  Skin:    Findings: Rash (Lichenified, erythematous, slightly scaly palmar surface of hands bilaterally and ankles.) present. No erythema.     Nails: There is no clubbing.  Neurological:     Mental Status: She is alert.    Diagnostics: none  Assessment and Plan:   1. Other atopic dermatitis   2. Asthma, well controlled, mild persistent   3. Other allergic rhinitis   4. Allergy with anaphylaxis due to food     1. Continue Dupilumab injections  2. Can try Opzelura - apply to eczema 1-2 times per day  2. If needed:   A.  Mometasone 0.1% cream 1-2 times per day  C.  EpiCeram   D.  Hydroxyzine 25 mg 2 times per day  E.  Flonase 1-2 sprays each nostril 1 time per day  F.  Auvi-Q 0.3  G. Albuterol HFA   3. Continue 'action plan' for asthma flare up including QVAR 80 REDIHALER 3 inhalations three times per day  4. Return to clinic in 6 months or earlier if needed  5. Obtain fall flu vaccine  Megan Lynn can try a topical Jak inhibitor for the persistent eczema involving her hands and ankles and she will continue on dupilumab on a consistent basis which has resulted in dramatic improvement regarding control of her multiorgan atopic disease especially her atopic dermatitis.  She has a very good understanding of her disease state and how  her medications work and appropriate dosing of her medications depending on disease activity.  Assuming she does well with the plan noted above we will see her back in this clinic in 6 months or earlier if there is a problem.  Megan Schimke, MD Allergy / Immunology [facility]

## 2021-04-15 NOTE — Telephone Encounter (Signed)
Im not sure what daily dose is to high means.

## 2021-04-16 ENCOUNTER — Encounter: Payer: Self-pay | Admitting: Allergy and Immunology

## 2021-05-06 NOTE — Progress Notes (Signed)
Patient stated, Dr. Lucie Leather told her to come by the [facility] office to pick up a sample of Dupixent. Dupixent given to patient by LS.

## 2021-06-08 ENCOUNTER — Other Ambulatory Visit: Payer: Self-pay

## 2021-06-08 MED ORDER — OPZELURA 1.5 % EX CREA: 1.0000 "application " | TOPICAL_CREAM | Freq: Two times a day (BID) | CUTANEOUS | 5 refills | Status: DC

## 2021-06-08 MED ORDER — EPICERAM EX EMUL: 1.0000 "application " | Freq: Two times a day (BID) | CUTANEOUS | 1 refills | Status: DC

## 2021-10-19 ENCOUNTER — Other Ambulatory Visit: Payer: Self-pay

## 2021-10-19 ENCOUNTER — Encounter: Payer: Self-pay | Admitting: Allergy and Immunology

## 2021-10-19 ENCOUNTER — Ambulatory Visit: Payer: Medicare PPO | Admitting: Allergy and Immunology

## 2021-10-19 VITALS — BP 120/80 | HR 100 | Resp 14 | Ht 68.5 in | Wt 243.4 lb

## 2021-10-19 DIAGNOSIS — L2089 Other atopic dermatitis: Secondary | ICD-10-CM

## 2021-10-19 DIAGNOSIS — J3089 Other allergic rhinitis: Secondary | ICD-10-CM | POA: Diagnosis not present

## 2021-10-19 DIAGNOSIS — J4530 Mild persistent asthma, uncomplicated: Secondary | ICD-10-CM | POA: Diagnosis not present

## 2021-10-19 DIAGNOSIS — T7800XA Anaphylactic reaction due to unspecified food, initial encounter: Secondary | ICD-10-CM

## 2021-10-19 MED ORDER — MOMETASONE FUROATE 0.1 % EX CREA: TOPICAL_CREAM | CUTANEOUS | 3 refills | Status: DC

## 2021-10-19 MED ORDER — OPZELURA 1.5 % EX CREA: 1.0000 "application " | TOPICAL_CREAM | Freq: Two times a day (BID) | CUTANEOUS | 5 refills | Status: DC

## 2021-10-19 MED ORDER — HYDROXYZINE HCL 25 MG PO TABS: ORAL_TABLET | ORAL | 1 refills | Status: DC

## 2021-10-19 NOTE — Progress Notes (Signed)
[place] - [place] - [place] - [place] - [place]   Follow-up Note  Referring Provider: [name], MD Primary Provider: [name], MD Date of Office Visit: 10/19/2021  Subjective:   Megan Lynn (DOB: February 15, 1971) is a 51 y.o. female who [facility] on 10/19/2021 in re-evaluation of the following:  HPI: Megan Lynn returns to this clinic in reevaluation of atopic dermatitis, asthma, allergic rhinitis, and history of food allergy directed against shellfish.  Her last visit in this clinic was 15 April 2021.  Overall she has done well while using dupilumab to control her atopic dermatitis but she still has a requirement to use topical anti-inflammatory agents, currentlyOpzulera on her hands and mometasone on the other areas of her body when she flares.  She uses these agents a few times per week.  She is not sure that dupilumab is really helping her to the extent that she so desires.  Her asthma has been invisible.  She has a rare use of a short acting bronchodilator.  She has not required a systemic steroid to treat an exacerbation.  Her nose is doing well while using some nasal steroid.  She has not required an antibiotic to treat an episode of sinusitis.  She is using some steroid eyedrops prescribed by her ophthalmologist occasionally for her conjunctivitis.  She remains away from consumption of shellfish.  She has received this year's flu vaccine and has received 5 COVID vaccines.  Allergies as of 10/19/2021       Reactions   Erythromycin Shortness Of Breath, Rash   Shellfish Allergy Shortness Of Breath, Rash   Sulfa Antibiotics Shortness Of Breath, Rash, Anaphylaxis   Doxycycline Nausea Only, Other (See Comments)   Horrible heart burn        Medication List    albuterol 108 (90 Base) MCG/ACT inhaler Commonly known as: ProAir HFA INHALE 2 PUFFS BY MOUTH EVERY 6 HOURS AS NEEDED FOR WHEEZING OR SHORTNESS OF BREATH    Auvi-Q 0.3 mg/0.3 mL Soaj injection Generic drug: EPINEPHrine Use as directed for life-threatening allergic reaction.   cetirizine 10 MG tablet Commonly known as: ZYRTEC Take 10 mg by mouth daily.   Dupixent 300 MG/2ML prefilled syringe Generic drug: dupilumab INJECT 300 MG SUBCUTANEOUSLY EVERY OTHER WEEK   EpiCeram lotion Apply to inflamed skin multiple times per day if needed   EpiCeram lotion Apply 1 application topically 2 (two) times daily with a meal.   fluticasone 50 MCG/ACT nasal spray Commonly known as: FLONASE Place 1 spray into both nostrils 2 (two) times daily.   hydrOXYzine 25 MG tablet Commonly known as: ATARAX Take 1 tablet 2 times per day   metFORMIN 500 MG 24 hr tablet Commonly known as: GLUCOPHAGE-XR TAKE 1 TABLET ONCE DAILY FOR A WEEK THEN TAKE 2 TABLETS DAILY   mometasone 0.1 % cream Commonly known as: ELOCON APPLY  CREAM TOPICALLY TO AFFECTED AREA ONCE DAILY AS NEEDED   Opzelura 1.5 % Crea Generic drug: Ruxolitinib Phosphate Apply 1 application topically 2 (two) times daily.    Past Medical History:  Diagnosis Date   Asthma    Diabetes (HCC)    Eczema    Food allergy     Past Surgical History:  Procedure Laterality Date   GLAUCOMA SURGERY     TOE SURGERY Left 04/2018   TYMPANOSTOMY TUBE PLACEMENT      Review of systems negative except as noted in HPI / PMHx or noted below:  Review of  Systems  Constitutional: Negative.   HENT: Negative.    Eyes: Negative.   Respiratory: Negative.    Cardiovascular: Negative.   Gastrointestinal: Negative.   Genitourinary: Negative.   Musculoskeletal: Negative.   Skin: Negative.   Neurological: Negative.   Endo/Heme/Allergies: Negative.   Psychiatric/Behavioral: Negative.      Objective:   Vitals:   10/19/21 1103  BP: 120/80  Pulse: 100  Resp: 14  SpO2: 98%   Height: 5' 8.5" (174 cm)  Weight: 243 lb 6.4 oz (110.4 kg)   Physical Exam Constitutional:      Appearance: She is not  diaphoretic.  HENT:     Head: Normocephalic.     Right Ear: Tympanic membrane, ear canal and external ear normal.     Left Ear: Tympanic membrane, ear canal and external ear normal.     Nose: Nose normal. No mucosal edema or rhinorrhea.     Mouth/Throat:     Pharynx: Uvula midline. No oropharyngeal exudate.  Eyes:     Conjunctiva/sclera: Conjunctivae normal.  Neck:     Thyroid: No thyromegaly.     Trachea: Trachea normal. No tracheal tenderness or tracheal deviation.  Cardiovascular:     Rate and Rhythm: Normal rate and regular rhythm.     Heart sounds: Normal heart sounds, S1 normal and S2 normal. No murmur heard. Pulmonary:     Effort: No respiratory distress.     Breath sounds: Normal breath sounds. No stridor. No wheezing or rales.  Lymphadenopathy:     Head:     Right side of head: No tonsillar adenopathy.     Left side of head: No tonsillar adenopathy.     Cervical: No cervical adenopathy.  Skin:    Findings: Rash (Palmar erythema, lichenification, minute split.) present. No erythema.     Nails: There is no clubbing.  Neurological:     Mental Status: She is alert.    Diagnostics:    Spirometry was performed and demonstrated an FEV1 of 2.05 at 64 % of predicted.  Assessment and Plan:   1. Other atopic dermatitis   2. Asthma, well controlled, mild persistent   3. Other allergic rhinitis   4. Allergy with anaphylaxis due to food     1.  Start Adbry injections to replace dupilumab  2.  If needed:   A.  Mometasone 0.1% cream 1-2 times per day  B.  Opzelura - apply to eczema 1-2 times per day  C.  EpiCeram   D.  Hydroxyzine 25 mg 2 times per day  E.  Flonase 1-2 sprays each nostril 1 time per day  F.  Auvi-Q 0.3  G. Albuterol HFA   3. Continue 'action plan' for asthma flare up including QVAR 80 REDIHALER 3 inhalations three times per day  4. Return to clinic in 6 months or earlier if needed  I think that Silvio Pate needs to use a biologic agent to control her  multiorgan atopic disease especially her very severe atopic dermatitis.  Although she appears to be doing okay at this point in time on her current plan she always develops significant flare up once pollen exposure occurs.  We will change her from dupilumab to tralokinumab.  I will see her back in this clinic in 6 months or earlier if there is a problem.  Laurette Schimke, MD Allergy / Immunology [facility]

## 2021-10-19 NOTE — Patient Instructions (Addendum)
°  1.  Start Adbry injections to replace dupilumab  2.  If needed:   A.  Mometasone 0.1% cream 1-2 times per day  B.  Opzelura - apply to eczema 1-2 times per day  C.  EpiCeram   D.  Hydroxyzine 25 mg 2 times per day  E.  Flonase 1-2 sprays each nostril 1 time per day  F.  Auvi-Q 0.3  G. Albuterol HFA   3. Continue 'action plan' for asthma flare up including QVAR 80 REDIHALER 3 inhalations three times per day  4. Return to clinic in 6 months or earlier if needed

## 2021-10-20 ENCOUNTER — Telehealth: Payer: Self-pay | Admitting: *Deleted

## 2021-10-20 ENCOUNTER — Encounter: Payer: Self-pay | Admitting: Allergy and Immunology

## 2021-10-20 NOTE — Telephone Encounter (Signed)
L/m for patient to discuss change to Adbry from [place]

## 2021-10-20 NOTE — Telephone Encounter (Signed)
-----   Message from Jiles Prows, MD sent at 10/19/2021 11:21 AM EST ----- Change dupi to [employer]

## 2021-10-29 NOTE — Telephone Encounter (Signed)
Patient called back and advised I would send her app for Adbry ?

## 2022-01-11 ENCOUNTER — Other Ambulatory Visit: Payer: Self-pay | Admitting: *Deleted

## 2022-01-11 MED ORDER — ADBRY 150 MG/ML ~~LOC~~ SOSY: 600.0000 mg | PREFILLED_SYRINGE | Freq: Once | SUBCUTANEOUS | 11 refills | Status: AC

## 2022-03-09 ENCOUNTER — Other Ambulatory Visit: Payer: Self-pay | Admitting: Allergy and Immunology

## 2022-03-15 ENCOUNTER — Ambulatory Visit: Payer: Medicare PPO | Admitting: Podiatry

## 2022-03-15 DIAGNOSIS — E0843 Diabetes mellitus due to underlying condition with diabetic autonomic (poly)neuropathy: Secondary | ICD-10-CM | POA: Diagnosis not present

## 2022-03-15 DIAGNOSIS — L97522 Non-pressure chronic ulcer of other part of left foot with fat layer exposed: Secondary | ICD-10-CM

## 2022-03-15 MED ORDER — GENTAMICIN SULFATE 0.1 % EX CREA: 1.0000 | TOPICAL_CREAM | Freq: Two times a day (BID) | CUTANEOUS | 1 refills | Status: DC

## 2022-03-15 NOTE — Progress Notes (Signed)
Chief Complaint  Patient presents with   Callouses    Callous on left foot middle toe    Subjective:  51 y.o. female with PMHx of diabetes mellitus presenting today for a new complaint of ulcers that is developed to the plantar aspect of the left foot as well as the left third digit.  Patient states the ulcers been present for few months now.  She has not done anything for treatment.  She presents for further treatment and evaluation  Past Medical History:  Diagnosis Date   Asthma    Diabetes (HCC)    Eczema    Food allergy    Past Surgical History:  Procedure Laterality Date   GLAUCOMA SURGERY     TOE SURGERY Left 04/2018   TYMPANOSTOMY TUBE PLACEMENT     Allergies  Allergen Reactions   Erythromycin Shortness Of Breath and Rash   Shellfish Allergy Shortness Of Breath and Rash   Sulfa Antibiotics Shortness Of Breath, Rash and Anaphylaxis   Doxycycline Nausea Only and Other (See Comments)    Horrible heart burn     Objective/Physical Exam General: The patient is alert and oriented x3 in no acute distress.  Dermatology:  Wound #1 noted to the plantar aspect of the first MTP joint measuring approximately 1.0 x 1.0 x 0.2 cm (LxWxD).   Wound #2 also noted to the distal tip of the left third digit measuring approximately 0.5 x 0.6 x 0.1  To the noted ulceration(s), there is no eschar. There is a moderate amount of slough, fibrin, and necrotic tissue noted. Granulation tissue and wound base is red. There is a minimal amount of serosanguineous drainage noted. There is no exposed bone muscle-tendon ligament or joint. There is no malodor. Periwound integrity is intact. Skin is warm, dry and supple bilateral lower extremities.  Vascular: Palpable pedal pulses bilaterally. No edema or erythema noted. Capillary refill within normal limits.  Neurological: Epicritic and protective threshold diminished bilaterally.   Musculoskeletal Exam: History of prior partial toe amputation to  the second digit left foot  Assessment: 1.  Ulcer plantar aspect of the first MTP left as well as the left third digit secondary to diabetes mellitus 2. diabetes mellitus w/ peripheral neuropathy   Plan of Care:  1. Patient was evaluated. 2. medically necessary excisional debridement including subcutaneous tissue was performed using a tissue nipper and a chisel blade. Excisional debridement of all the necrotic nonviable tissue down to healthy bleeding viable tissue was performed with post-debridement measurements same as pre-. 3. the wound was cleansed and dry sterile dressing applied. 4.  Prescription for gentamicin cream.  Apply daily  5.  Patient currently wears good supportive new balance sneakers.  Offloading felt dancers pad was applied to the insole of the shoe to offload pressure from the first MTP joint  6.  We also did discuss the possibility of a flexor tenotomy to the left third digit to alleviate pressure from the distal tip of the toe if she fails conservative routine debridement and conservative wound care.   7.  Patient is to return to clinic in 3 weeks   Felecia Shelling, DPM [facility]  Dr. Felecia Shelling, DPM    [address], [place] [address]  Office [phone]  Fax [phone]

## 2022-03-19 ENCOUNTER — Telehealth: Payer: Self-pay | Admitting: *Deleted

## 2022-03-19 NOTE — Telephone Encounter (Signed)
PA has been submitted through CoverMyMeds for Opzelura and is currently pending approval/denial.  

## 2022-03-22 NOTE — Telephone Encounter (Signed)
PA has been approved for Opzelura and has been sent to patients pharmacy electronically through CoverMyMeds.PA is good for one year.

## 2022-04-07 ENCOUNTER — Ambulatory Visit (INDEPENDENT_AMBULATORY_CARE_PROVIDER_SITE_OTHER): Payer: Medicare PPO | Admitting: Podiatry

## 2022-04-07 ENCOUNTER — Encounter: Payer: Self-pay | Admitting: Podiatry

## 2022-04-07 DIAGNOSIS — L97522 Non-pressure chronic ulcer of other part of left foot with fat layer exposed: Secondary | ICD-10-CM | POA: Diagnosis not present

## 2022-04-07 DIAGNOSIS — M2042 Other hammer toe(s) (acquired), left foot: Secondary | ICD-10-CM

## 2022-04-07 NOTE — Progress Notes (Signed)
Chief Complaint  Patient presents with   Callouses    Patient is here for callous on left foot.    HPI: 51 y.o. female PMHx diabetes mellitus presenting today for follow-up evaluation of ulcers to the distal tip of the left third toe as well as the plantar aspect of the first MTP joint left foot.  Patient states that she is doing well.  She has been applying the antibiotic cream and a light Band-Aid.  She continues to have an ulcer to the distal tip of the third toe but she believes that the wound to the plantar aspect of the foot has healed.  Past Medical History:  Diagnosis Date   Asthma    Diabetes (HCC)    Eczema    Food allergy    Past Surgical History:  Procedure Laterality Date   GLAUCOMA SURGERY     TOE SURGERY Left 04/2018   TYMPANOSTOMY TUBE PLACEMENT     Allergies  Allergen Reactions   Erythromycin Shortness Of Breath and Rash   Shellfish Allergy Shortness Of Breath and Rash   Sulfa Antibiotics Shortness Of Breath, Rash and Anaphylaxis   Doxycycline Nausea Only and Other (See Comments)    Horrible heart burn      Objective: Physical Exam General: The patient is alert and oriented x3 in no acute distress.  Dermatology: Ulcer to the plantar aspect of the first MTP left foot has healed.  Complete reepithelialization has occurred.  There are some hyperkeratotic callus tissue around the area but no open wound.  There continues to be a wound to the distal tip of the left third toe complicated by the hammertoe contracture.  Wound measures approximately 0.2 x 0.3 x 0.1 cm.  No exposed bone muscle tendon ligament or joint.  Periwound callus noted.  Vascular: Palpable pedal pulses bilaterally. No edema or erythema noted. Capillary refill within normal limits.  Neurological: Epicritic and protective threshold grossly intact bilaterally.   Musculoskeletal Exam: Reducible hammertoe contracture deformity noted to the symptomatic toe  Assessment: 1.  Reducible hammertoe  third digit left complicated by ulcer to the distal tip of the toe 2.  Ulcer plantar aspect of the first MTP left; heel   Plan of Care:  1. Patient evaluated.  Medically necessary excisional debridement including subcutaneous tissue to the distal second toe was performed using a tissue nipper.  Excisional debridement of the necrotic nonviable tissue down to healthy bleeding viable tissue was performed postdebridement was right sinus. 2.  Different treatment options were discussed with the patient. 3.  After evaluating the patient I do believe that a flexor tenotomy to the respective digit would help alleviate the patient's symptoms.  This would lift the toe to alleviate pressure from the digit.  The procedure was explained in detail and all patient questions were answered.  No guarantees were expressed or implied.  The patient consented for correction here in the office 4.  Prior to procedure the toe was blocked in a digital block fashion using 3 mL of lidocaine 2%. 5.  Flexor tenotomy was performed of the respective digit using a surgical #11 scalpel and a small percutaneous stab incision on the plantar sulcus of the toe.  The toe was immediately in a more rectus position.  Betadine soaked dry sterile dressing was applied. 6.  Post care instructions were provided 7.  Surgical shoe dispensed 8.  Return to clinic in 1 week    Felecia Shelling, DPM [facility]  Dr. Felecia Shelling, DPM    [address] [place], [place] [address]                Office [phone]  Fax [phone]

## 2022-04-08 NOTE — Telephone Encounter (Signed)
Spoke with patient over the phone. Bleeding has stopped. They had fresh gauze and supplies for a dressing change. Offered for them to come into the office for evaluation and dressing change but they said they were fine. F/u next week at next scheduled appt. - Dr. Logan Bores

## 2022-04-13 ENCOUNTER — Encounter: Payer: Self-pay | Admitting: Podiatry

## 2022-04-15 ENCOUNTER — Other Ambulatory Visit: Payer: Self-pay | Admitting: Podiatry

## 2022-04-19 ENCOUNTER — Ambulatory Visit (INDEPENDENT_AMBULATORY_CARE_PROVIDER_SITE_OTHER): Payer: Medicare PPO | Admitting: Allergy and Immunology

## 2022-04-19 ENCOUNTER — Ambulatory Visit: Payer: Medicare PPO | Admitting: Podiatry

## 2022-04-19 ENCOUNTER — Encounter: Payer: Self-pay | Admitting: Allergy and Immunology

## 2022-04-19 VITALS — BP 114/80 | HR 88 | Resp 10 | Ht 69.5 in | Wt 235.4 lb

## 2022-04-19 DIAGNOSIS — J3089 Other allergic rhinitis: Secondary | ICD-10-CM

## 2022-04-19 DIAGNOSIS — J4530 Mild persistent asthma, uncomplicated: Secondary | ICD-10-CM

## 2022-04-19 DIAGNOSIS — T7800XA Anaphylactic reaction due to unspecified food, initial encounter: Secondary | ICD-10-CM

## 2022-04-19 DIAGNOSIS — L2089 Other atopic dermatitis: Secondary | ICD-10-CM

## 2022-04-19 DIAGNOSIS — M2042 Other hammer toe(s) (acquired), left foot: Secondary | ICD-10-CM

## 2022-04-19 NOTE — Patient Instructions (Addendum)
  1.  Continue Adbry injections    2.  If needed:   A.  Mometasone 0.1% cream 1-2 times per day  B.  Opzelura - apply to eczema 1-2 times per day  C.  EpiCeram   D.  Hydroxyzine 25 mg 2 times per day  E.  Flonase 1-2 sprays each nostril 1 time per day  F.  Auvi-Q 0.3  G. Albuterol HFA   3. Continue 'action plan' for asthma flare up including QVAR 80 REDIHALER 3 inhalations three times per day  4.  Rinvoq???  Yes, if Adbry inefficient in controlling atopic dermatitis  5. Return to clinic in 6 months or earlier if needed  6. Obtain fall flu vaccine

## 2022-04-19 NOTE — Progress Notes (Signed)
   Chief Complaint  Patient presents with   Callouses    Left foot callus. Patient denies nausea, vomiting, fever, chills and no pain.    Subjective:  Patient presents today status post flexor tenotomy of the third digit left foot performed in office on 04/07/2022.  Patient states that she is doing much better.  The small stab incision has healed.  She feels incredible improvement and with alleviation of symptoms to the toe.  No new complaints at this time.   Past Medical History:  Diagnosis Date   Asthma    Diabetes (HCC)    Eczema    Food allergy     Past Surgical History:  Procedure Laterality Date   GLAUCOMA SURGERY     TOE SURGERY Left 04/2018   TYMPANOSTOMY TUBE PLACEMENT      Allergies  Allergen Reactions   Erythromycin Shortness Of Breath and Rash   Shellfish Allergy Shortness Of Breath and Rash   Sulfa Antibiotics Shortness Of Breath, Rash and Anaphylaxis   Doxycycline Nausea Only and Other (See Comments)    Horrible heart burn    Objective/Physical Exam Neurovascular status intact.  Skin incision healed.  The toes in a more rectus alignment.  No open wounds noted  Assessment: 1. s/p flexor tenotomy left third digit performed in office 04/07/2022.    Plan of Care:  1. Patient was evaluated.  2.  Patient may resume good supportive shoes and sneakers 3.  Advised against going barefoot 4.  Return to clinic 3 months  *Going to [place] to visit her girlfriend in about 3 months  Felecia Shelling, DPM [facility]  Dr. Felecia Shelling, DPM    [address] [place], [place] [address]                Office [phone]  Fax [phone]

## 2022-04-19 NOTE — Progress Notes (Unsigned)
[place] [facility] [place] [facility] [place] [facility] [place] -    Follow-up Note  Referring Provider: [name], MD Primary Provider: [name], MD Date of Office Visit: 04/19/2022  Subjective:   Megan Lynn (DOB: 01/19/1971) is a 51 y.o. female who [facility] in re-evaluation of the following:  HPI: Megan Lynn returns to this clinic in evaluation of multiorgan atopic disease including severe atopic dermatitis, asthma, allergic rhinitis, and history of food allergy directed against shellfish.  Her last visit to this clinic was 19 October 2021.  During her last visit she was not having a good response to dupilumab administration and we switched her to anti-IL-13 agent.  She did well for several months and did not use any biologic agents but unfortunately developed a rather significant flare of her skin condition in July for which she took another loading dose of Adbry.  Currently she is doing relatively well with some involvement of her hands but no other areas of her body while also using topical mometasone and rarely some Opzelura.  She has difficulty paying for Opzelura as it is very expensive even though this agent does work pretty well regarding her atopic dermatitis.  She had very little issues with her airway and rarely uses a short acting bronchodilator and has not had to activate her action plan with the use of Qvar.  She has not required a systemic steroid or an antibiotic to treat any type of airway issue.  She remains away from shellfish consumption.  Allergies as of 04/19/2022       Reactions   Ciprofloxacin Hives, Shortness Of Breath   Erythromycin Shortness Of Breath, Rash   Shellfish Allergy Shortness Of Breath, Rash   Sulfa Antibiotics Shortness Of Breath, Rash, Anaphylaxis   Doxycycline Nausea Only, Other (See Comments)   Horrible heart burn        Medication List   albuterol 108 (90 Base) MCG/ACT  inhaler Commonly known as: ProAir HFA INHALE 2 PUFFS BY MOUTH EVERY 6 HOURS AS NEEDED FOR WHEEZING OR SHORTNESS OF BREATH   Auvi-Q 0.3 mg/0.3 mL Soaj injection Generic drug: EPINEPHrine Use as directed for life-threatening allergic reaction.   cetirizine 10 MG tablet Commonly known as: ZYRTEC Take 10 mg by mouth daily.   fluticasone 50 MCG/ACT nasal spray Commonly known as: FLONASE Place 1 spray into both nostrils 2 (two) times daily.   gentamicin cream 0.1 % Commonly known as: GARAMYCIN APPLY  CREAM TOPICALLY TO AFFECTED AREA TWICE DAILY   hydrOXYzine 25 MG tablet Commonly known as: ATARAX Take 1 tablet by mouth twice daily   metFORMIN 500 MG 24 hr tablet Commonly known as: GLUCOPHAGE-XR TAKE 1 TABLET ONCE DAILY FOR A WEEK THEN TAKE 2 TABLETS DAILY   mometasone 0.1 % cream Commonly known as: ELOCON APPLY  CREAM TOPICALLY TO AFFECTED AREA ONCE DAILY AS NEEDED   Mounjaro 10 MG/0.5ML Pen Generic drug: tirzepatide SMARTSIG:10 Milligram(s) SUB-Q Once a Week   Opzelura 1.5 % Crea Generic drug: Ruxolitinib Phosphate Apply 1 application topically 2 (two) times daily.   VITAMIN D3 PO Take by mouth daily.    Past Medical History:  Diagnosis Date   Asthma    Diabetes (HCC)    Eczema    Food allergy     Past Surgical History:  Procedure Laterality Date   GLAUCOMA SURGERY     TOE SURGERY Left 04/2018   TOE SURGERY Left    TYMPANOSTOMY TUBE PLACEMENT  Review of systems negative except as noted in HPI / PMHx or noted below:  Review of Systems  Constitutional: Negative.   HENT: Negative.    Eyes: Negative.   Respiratory: Negative.    Cardiovascular: Negative.   Gastrointestinal: Negative.   Genitourinary: Negative.   Musculoskeletal: Negative.   Skin: Negative.   Neurological: Negative.   Endo/Heme/Allergies: Negative.   Psychiatric/Behavioral: Negative.       Objective:   Vitals:   04/19/22 1101  BP: 114/80  Pulse: 88  Resp: 10  SpO2: 97%    Height: 5' 9.5" (176.5 cm)  Weight: 235 lb 6.4 oz (106.8 kg)   Physical Exam Constitutional:      Appearance: She is not diaphoretic.  HENT:     Head: Normocephalic.     Right Ear: Tympanic membrane, ear canal and external ear normal.     Left Ear: Tympanic membrane, ear canal and external ear normal.     Nose: Nose normal. No mucosal edema or rhinorrhea.     Mouth/Throat:     Pharynx: Uvula midline. No oropharyngeal exudate.  Eyes:     Conjunctiva/sclera: Conjunctivae normal.  Neck:     Thyroid: No thyromegaly.     Trachea: Trachea normal. No tracheal tenderness or tracheal deviation.  Cardiovascular:     Rate and Rhythm: Normal rate and regular rhythm.     Heart sounds: Normal heart sounds, S1 normal and S2 normal. No murmur heard. Pulmonary:     Effort: No respiratory distress.     Breath sounds: Normal breath sounds. No stridor. No wheezing or rales.  Lymphadenopathy:     Head:     Right side of head: No tonsillar adenopathy.     Left side of head: No tonsillar adenopathy.     Cervical: No cervical adenopathy.  Skin:    Findings: No erythema or rash (Elbow, knee, palmar surface of hand erythema and lichenification).     Nails: There is no clubbing.  Neurological:     Mental Status: She is alert.     Diagnostics: none  Assessment and Plan:   1. Other atopic dermatitis   2. Asthma, well controlled, mild persistent   3. Other allergic rhinitis   4. Allergy with anaphylaxis due to food    1.  Continue Adbry injections    2.  If needed:   A.  Mometasone 0.1% cream 1-2 times per day  B.  Opzelura - apply to eczema 1-2 times per day  C.  EpiCeram   D.  Hydroxyzine 25 mg 2 times per day  E.  Flonase 1-2 sprays each nostril 1 time per day  F.  Auvi-Q 0.3  G. Albuterol HFA   3. Continue 'action plan' for asthma flare up including QVAR 80 REDIHALER 3 inhalations three times per day  4.  Rinvoq???  Yes, if Adbry inefficient in controlling atopic  dermatitis  5. Return to clinic in 6 months or earlier if needed  6. Obtain fall flu vaccine  Megan Lynn will use her anti-IL-3 biologic agent in an attempt to get her skin condition under good control.  If this is insufficient in controlling her atopic dermatitis then we will probably need to have her move onto oral Jak inhibitor.  I suspect she would do really well with this agent especially given the fact that her topical Jak inhibitor results in good control of her atopic dermatitis although it is a little bit too expensive for her to use on a consistent basis.  Fortunately, her airway issue is not a particularly big problem at this point in time.  Of course she is going to remain away from shellfish consumption.  We will see her back in this clinic in 6 months or earlier if there is a problem.  Laurette Schimke, MD Allergy / Immunology [facility]

## 2022-04-20 ENCOUNTER — Encounter: Payer: Self-pay | Admitting: Allergy and Immunology

## 2022-05-12 ENCOUNTER — Other Ambulatory Visit: Payer: Self-pay | Admitting: Allergy and Immunology

## 2022-06-22 ENCOUNTER — Ambulatory Visit (INDEPENDENT_AMBULATORY_CARE_PROVIDER_SITE_OTHER): Payer: Medicare PPO

## 2022-06-22 ENCOUNTER — Ambulatory Visit: Payer: Medicare PPO | Admitting: Podiatry

## 2022-06-22 DIAGNOSIS — L97512 Non-pressure chronic ulcer of other part of right foot with fat layer exposed: Secondary | ICD-10-CM

## 2022-06-22 DIAGNOSIS — E0843 Diabetes mellitus due to underlying condition with diabetic autonomic (poly)neuropathy: Secondary | ICD-10-CM

## 2022-06-22 MED ORDER — GENTAMICIN SULFATE 0.1 % EX CREA: TOPICAL_CREAM | CUTANEOUS | 1 refills | Status: DC

## 2022-06-22 MED ORDER — AMOXICILLIN-POT CLAVULANATE 875-125 MG PO TABS: 1.0000 | ORAL_TABLET | Freq: Two times a day (BID) | ORAL | 0 refills | Status: DC

## 2022-06-22 NOTE — Progress Notes (Signed)
Chief Complaint  Patient presents with   Callouses    Patient  is here for bilateral calloses that are very painful.    Subjective:  51 y.o. female with PMHx of diabetes mellitus presenting to the office today for evaluation of ulcers that developed to the patient's plantar aspect of the right foot.  Patient has a history of recurrent diabetic foot ulcers.  Patient noticed this when about 2 weeks ago.  She completed an oral round of Augmentin that she says she had at her home and she has been applying gentamicin cream.  She presents for further treatment and evaluation   Past Medical History:  Diagnosis Date   Asthma    Diabetes ([place])    Eczema    Food allergy     Past Surgical History:  Procedure Laterality Date   GLAUCOMA SURGERY     TOE SURGERY Left 04/2018   TOE SURGERY Left    TYMPANOSTOMY TUBE PLACEMENT      Allergies  Allergen Reactions   Ciprofloxacin Hives and Shortness Of Breath   Erythromycin Shortness Of Breath and Rash   Shellfish Allergy Shortness Of Breath and Rash   Sulfa Antibiotics Shortness Of Breath, Rash and Anaphylaxis   Doxycycline Nausea Only and Other (See Comments)    Horrible heart burn      Objective/Physical Exam General: The patient is alert and oriented x3 in no acute distress.  Dermatology:  Ulcers noted to the plantar aspect of the fifth MTP measuring approximately 1.3 x 1.3 x 0.2 cm (LxWxD).  An additional smaller ulcer noted to the plantar aspect of the first MTP measuring approximately 0.5 x 0.5 x 0.17 L.  To the noted ulceration(s), there is no eschar. There is a moderate amount of slough, fibrin, and necrotic tissue noted. Granulation tissue and wound base is red. There is a minimal amount of serosanguineous drainage noted. There is no exposed bone muscle-tendon ligament or joint. There is no malodor. Periwound integrity is intact. Skin is warm, dry and supple bilateral lower extremities.  Vascular: Palpable pedal pulses  bilaterally. No edema or erythema noted. Capillary refill within normal limits.  Neurological: Epicritic and protective threshold diminished bilaterally.   Musculoskeletal Exam: High arches noted with plantarflexed metatarsals and hammertoe deformities creating excessive pressure on the metatarsal heads contributing to the patient's ulcer development  Radiographic exam RT foot 06/22/2022: Normal osseous mineralization.  No degenerative changes or cortical irregularities concerning for osteomyelitis.  Metatarsus adductus noted  Assessment: 1.  Ulcers right foot secondary to diabetes mellitus 2. diabetes mellitus w/ peripheral neuropathy.  Last A1c 05/12/2022 5.9   Plan of Care:  1. Patient was evaluated. 2. medically necessary excisional debridement including subcutaneous tissue was performed using a tissue nipper and a chisel blade. Excisional debridement of all the necrotic nonviable tissue down to healthy bleeding viable tissue was performed with post-debridement measurements same as pre-. 3. the wound was cleansed and dry sterile dressing applied. 4.  Refill prescription for Augmentin 875/125 mg #20 x 10 days 5.  Refill prescription for gentamicin cream apply daily with a light dressing 6.  Immobilization cam boot dispensed.  WBAT 7.  Return to clinic 1 week  *Leaving to [place] to visit her friend for 8 days   Edrick Kins, DPM [facility]  Dr. Edrick Kins, DPM    [address]  [place], [place] [address]                Office [phone]  Fax [phone]

## 2022-06-30 ENCOUNTER — Ambulatory Visit: Payer: Medicare PPO | Admitting: Podiatry

## 2022-06-30 ENCOUNTER — Encounter: Payer: Self-pay | Admitting: Allergy and Immunology

## 2022-06-30 DIAGNOSIS — E0843 Diabetes mellitus due to underlying condition with diabetic autonomic (poly)neuropathy: Secondary | ICD-10-CM

## 2022-06-30 DIAGNOSIS — L97512 Non-pressure chronic ulcer of other part of right foot with fat layer exposed: Secondary | ICD-10-CM

## 2022-06-30 NOTE — Progress Notes (Signed)
Chief Complaint  Patient presents with   Follow-up    callus pain follow up    Callouses    Callus to left ball of foot. Painful.     Subjective:  51 y.o. female with PMHx of diabetes mellitus presenting to the office today for evaluation follow-up evaluation of ulcers to the plantar aspect of the right forefoot.  Patient has noticed significant improvement since last visit.  She completed the oral antibiotics that were prescribed. Patient did not wear the cam boot as instructed. Patient is also requesting callus debridement to the left foot   Past Medical History:  Diagnosis Date   Asthma    Diabetes ([place])    Eczema    Food allergy     Past Surgical History:  Procedure Laterality Date   GLAUCOMA SURGERY     TOE SURGERY Left 04/2018   TOE SURGERY Left    TYMPANOSTOMY TUBE PLACEMENT      Allergies  Allergen Reactions   Ciprofloxacin Hives and Shortness Of Breath   Erythromycin Shortness Of Breath and Rash   Shellfish Allergy Shortness Of Breath and Rash   Sulfa Antibiotics Shortness Of Breath, Rash and Anaphylaxis   Doxycycline Nausea Only and Other (See Comments)    Horrible heart burn      Objective/Physical Exam General: The patient is alert and oriented x3 in no acute distress.  Dermatology:  Ulcers noted to the plantar aspect of the fifth MTP measuring approximately 0.8x0.8 x 0.2 cm (LxWxD).  An additional smaller ulcer noted to the plantar aspect of the first MTP measuring approximately 0.2x0.2 x 0.1 centimeters  To the noted ulceration(s), there is no eschar. There is a moderate amount of slough, fibrin, and necrotic tissue noted. Granulation tissue and wound base is red. There is a minimal amount of serosanguineous drainage noted. There is no exposed bone muscle-tendon ligament or joint. There is no malodor. Periwound integrity is intact.  Hyperkeratotic callus noted to the plantar aspect of the first MTP left foot  Vascular: Palpable pedal pulses  bilaterally. No edema or erythema noted. Capillary refill within normal limits.  Neurological: Epicritic and protective threshold diminished bilaterally.   Musculoskeletal Exam: High arches noted with plantarflexed metatarsals and hammertoe deformities creating excessive pressure on the metatarsal heads contributing to the patient's ulcer development  Radiographic exam RT foot 06/22/2022: Normal osseous mineralization.  No degenerative changes or cortical irregularities concerning for osteomyelitis.  Metatarsus adductus noted  Assessment: 1.  Ulcers right foot secondary to diabetes mellitus 2. diabetes mellitus w/ peripheral neuropathy.  Last A1c 05/12/2022 5.9   Plan of Care:  1. Patient was evaluated. 2. medically necessary excisional debridement including subcutaneous tissue was performed using a tissue nipper and a chisel blade. Excisional debridement of all the necrotic nonviable tissue down to healthy bleeding viable tissue was performed with post-debridement measurements same as pre-. 3. the wound was cleansed and dry sterile dressing applied. 4.  Patient completed the oral Augmentin that was prescribed 5.  Continue gentamicin cream and a light Band-Aid 6.  Discontinue cam boot.  Patient states that she never really wore the cam boot anyways. 7.  Recommend good supportive shoes.  Advised against going barefoot.  Offloading felt metatarsal pads were applied to the insoles in the shoes to offload pressure from the forefoot 8.  Light excisional debridement of the callus was performed to the plantar aspect of the first MTP left foot.  There was some slight bleeding with debridement.  Antibiotic ointment and  a light dressing was applied.  9. Return to clinic in 3 weeks  *Leaving to [place] to visit her friend for 8 days.  Leaving Monday, 07/05/2022   Edrick Kins, DPM [facility]  Dr. Edrick Kins, DPM    [address], [place] [address]                Office [phone]  Fax [phone]

## 2022-07-26 ENCOUNTER — Ambulatory Visit: Payer: Medicare PPO | Admitting: Podiatry

## 2022-07-26 DIAGNOSIS — L97512 Non-pressure chronic ulcer of other part of right foot with fat layer exposed: Secondary | ICD-10-CM

## 2022-07-26 NOTE — Progress Notes (Signed)
   No chief complaint on file.   Subjective:  51 y.o. female with PMHx of diabetes mellitus presenting to the office today for evaluation follow-up evaluation of ulcers to the plantar aspect of the right forefoot.  Overall significant improvement.  No new complaints at this time   Past Medical History:  Diagnosis Date   Asthma    Diabetes (HCC)    Eczema    Food allergy     Past Surgical History:  Procedure Laterality Date   GLAUCOMA SURGERY     TOE SURGERY Left 04/2018   TOE SURGERY Left    TYMPANOSTOMY TUBE PLACEMENT      Allergies  Allergen Reactions   Ciprofloxacin Hives and Shortness Of Breath   Erythromycin Shortness Of Breath and Rash   Shellfish Allergy Shortness Of Breath and Rash   Sulfa Antibiotics Shortness Of Breath, Rash and Anaphylaxis   Doxycycline Nausea Only and Other (See Comments)    Horrible heart burn     RT foot.  07/26/2022  Objective/Physical Exam General: The patient is alert and oriented x3 in no acute distress.  Dermatology:  Significant improvement.  Partial-thickness small wounds to the plantar first and fifth MTP. No drainage.  No erythema or edema.  Hyperkeratotic callus noted to the plantar aspect of the first MTP left foot  Vascular: Palpable pedal pulses bilaterally. No edema or erythema noted. Capillary refill within normal limits.  Neurological: Epicritic and protective threshold diminished bilaterally.   Musculoskeletal Exam: High arches noted with plantarflexed metatarsals and hammertoe deformities creating excessive pressure on the metatarsal heads contributing to the patient's ulcer development  Radiographic exam RT foot 06/22/2022: Normal osseous mineralization.  No degenerative changes or cortical irregularities concerning for osteomyelitis.  Metatarsus adductus noted  Assessment: 1.  Ulcers right foot secondary to diabetes mellitus 2. diabetes mellitus w/ peripheral neuropathy.  Last A1c 05/12/2022 5.9   Plan of  Care:  1. Patient was evaluated. 2. Light excisional debridement of the callus was performed to the plantar aspect of the first and fifth MTP left foot.   3.  Continue to wear good supportive shoes and sneakers with offloading felt metatarsal pads 4.  Appointment with orthotics department for custom molded accommodative orthotics with metatarsal bar, hug arch to offload pressure from the forefoot 5.  Return to clinic 6 weeks  Felecia Shelling, DPM [facility]  Dr. Felecia Shelling, DPM    [address] [place], [place] [address]                Office [phone]  Fax [phone]

## 2022-08-25 ENCOUNTER — Other Ambulatory Visit (HOSPITAL_COMMUNITY): Payer: Self-pay

## 2022-08-25 ENCOUNTER — Telehealth: Payer: Self-pay

## 2022-08-25 MED ORDER — OPZELURA 1.5 % EX CREA: 1.0000 "application " | TOPICAL_CREAM | Freq: Two times a day (BID) | CUTANEOUS | 5 refills | Status: DC

## 2022-08-25 NOTE — Telephone Encounter (Signed)
Patient Advocate Encounter   Received notification from [facility] that prior authorization is required for Opzelura 1.5% cream  Key [id number]  This medication does not require a prior authorization at this time. Alternatives also do not require a prior authorization. Covered alternatives, and co-pays, listed below.

## 2022-08-25 NOTE — Telephone Encounter (Signed)
I can try to send it to [facility] and see if its cheaper

## 2022-08-25 NOTE — Telephone Encounter (Signed)
Pts insurance covers opzelura at a cost $100 for pt bethamethasone is $4.16, triamcinolone is $4.15 tacrolimus is $9.02 please advise to change

## 2022-08-25 NOTE — Telephone Encounter (Signed)
Sent in opzleura to [facility]

## 2022-08-25 NOTE — Addendum Note (Signed)
Addended by: Berna Bue on: 08/25/2022 04:49 PM   Modules accepted: Orders

## 2022-09-02 ENCOUNTER — Telehealth: Payer: Self-pay

## 2022-09-02 ENCOUNTER — Other Ambulatory Visit (HOSPITAL_COMMUNITY): Payer: Self-pay

## 2022-09-02 NOTE — Telephone Encounter (Signed)
Patient Advocate Encounter   Received notification from [facility] that prior authorization is required for Opzelura 1.5% cream  Submitted: 09-02-2022 Key [id number]   Status is pending

## 2022-09-03 NOTE — Telephone Encounter (Signed)
Patient Advocate Encounter  Prior Authorization for Opzelura 1.5% cream  has been approved through [place].  KEY [id number]    Effective: 09-03-2022 to 08-30-2023  Determination letter attached to patient chart

## 2022-09-06 ENCOUNTER — Ambulatory Visit: Payer: Medicare PPO | Admitting: Podiatry

## 2022-09-06 VITALS — BP 128/91 | HR 102

## 2022-09-06 DIAGNOSIS — L97512 Non-pressure chronic ulcer of other part of right foot with fat layer exposed: Secondary | ICD-10-CM | POA: Diagnosis not present

## 2022-09-06 NOTE — Progress Notes (Signed)
   Chief Complaint  Patient presents with   Diabetic Ulcer    Diabetic ulcer right foot no drainage,     Subjective:  52 y.o. female with PMHx of diabetes mellitus presenting to the office today for follow-up evaluation of ulcers that developed to the plantar aspect of the right forefoot.  Patient continues to notice improvement.  She believes the ulcers have healed completely.  She does not go barefoot anymore.  She presents for further treatment and evaluation   Past Medical History:  Diagnosis Date   Asthma    Diabetes ([place])    Eczema    Food allergy     Past Surgical History:  Procedure Laterality Date   GLAUCOMA SURGERY     TOE SURGERY Left 04/2018   TOE SURGERY Left    TYMPANOSTOMY TUBE PLACEMENT      Allergies  Allergen Reactions   Ciprofloxacin Hives and Shortness Of Breath   Erythromycin Shortness Of Breath and Rash   Shellfish Allergy Shortness Of Breath and Rash   Sulfa Antibiotics Shortness Of Breath, Rash and Anaphylaxis   Doxycycline Nausea Only and Other (See Comments)    Horrible heart burn   Objective/Physical Exam General: The patient is alert and oriented x3 in no acute distress.  Dermatology:  The ulcer to the plantar aspect of the fifth MTP right foot has healed and resolved completely.  There is some hyperkeratotic callus tissue to the area.  Hyperkeratotic preulcerative calluses noted to the bilateral forefoot  Vascular: Palpable pedal pulses bilaterally. No edema or erythema noted. Capillary refill within normal limits.  Neurological: Epicritic and protective threshold diminished bilaterally.   Musculoskeletal Exam: High arches noted with plantarflexed metatarsals and hammertoe deformities creating excessive pressure on the metatarsal heads contributing to the patient's ulcer development  Radiographic exam RT foot 06/22/2022: Normal osseous mineralization.  No degenerative changes or cortical irregularities concerning for osteomyelitis.   Metatarsus adductus noted  Assessment: 1.  Ulcers right foot secondary to diabetes mellitus; resolved 2. diabetes mellitus w/ peripheral neuropathy.  Last A1c 05/12/2022 5.9 3.  Preulcerative callus lesions bilateral feet -Patient evaluated -Excisional debridement of the hyperkeratotic preulcerative callus tissue was performed using a 312 scalpel without incident or bleeding.  Patient felt significant relief -Continue wearing good supportive shoes and sneakers and diabetic insoles.  Advised against going barefoot -Return to clinic 2 months for routine footcare  Edrick Kins, DPM [facility]  Dr. Edrick Kins, DPM    [address] [place], [place] [address]                Office [phone]  Fax [phone]

## 2022-09-11 ENCOUNTER — Other Ambulatory Visit: Payer: Self-pay | Admitting: Allergy and Immunology

## 2022-09-14 ENCOUNTER — Encounter: Payer: Self-pay | Admitting: Allergy and Immunology

## 2022-09-20 ENCOUNTER — Telehealth: Payer: Self-pay | Admitting: *Deleted

## 2022-09-20 NOTE — Telephone Encounter (Signed)
L/M for patient to contact me regarding need for labs to start Rinvoq

## 2022-10-08 NOTE — Telephone Encounter (Signed)
L/m to call me again

## 2022-10-18 NOTE — Telephone Encounter (Signed)
L/m for patient to contact me after receiving message from her to discuss her biologic options

## 2022-11-08 ENCOUNTER — Ambulatory Visit: Payer: Medicare PPO | Admitting: Podiatry

## 2022-11-08 DIAGNOSIS — E0843 Diabetes mellitus due to underlying condition with diabetic autonomic (poly)neuropathy: Secondary | ICD-10-CM | POA: Diagnosis not present

## 2022-11-08 DIAGNOSIS — L97522 Non-pressure chronic ulcer of other part of left foot with fat layer exposed: Secondary | ICD-10-CM | POA: Diagnosis not present

## 2022-11-08 NOTE — Progress Notes (Signed)
   No chief complaint on file.   Subjective:  52 y.o. female with PMHx of diabetes mellitus presenting to the office today for follow-up evaluation of ulcers that developed to the plantar aspect of the right forefoot.  Last visit on 09/06/2022 the wounds had healed however she has noticed increased redness and swelling to the distal tip of the left second toenail.  She has been applying gentamicin cream with a Band-Aid.   Past Medical History:  Diagnosis Date   Asthma    Diabetes ([place])    Eczema    Food allergy     Past Surgical History:  Procedure Laterality Date   GLAUCOMA SURGERY     TOE SURGERY Left 04/2018   TOE SURGERY Left    TYMPANOSTOMY TUBE PLACEMENT      Allergies  Allergen Reactions   Ciprofloxacin Hives and Shortness Of Breath   Erythromycin Shortness Of Breath and Rash   Shellfish Allergy Shortness Of Breath and Rash   Sulfa Antibiotics Shortness Of Breath, Rash and Anaphylaxis   Doxycycline Nausea Only and Other (See Comments)    Horrible heart burn     Objective/Physical Exam General: The patient is alert and oriented x3 in no acute distress.  Dermatology:  Ulcer noted to the distal tip of the left second toe measuring approximately 0.5 x 0.2 time 0.1 cm.  Granular wound base.  No exposed bone muscle tendon ligament or joint.  Vascular: Palpable pedal pulses bilaterally. No edema or erythema noted. Capillary refill within normal limits.  There is some localized erythema around the distal tip of the left second toe  Neurological: Light touch and protective threshold diminished bilaterally.   Musculoskeletal Exam: High arches noted with plantarflexed metatarsals and hammertoe deformities creating excessive pressure on the metatarsal heads contributing to the patient's ulcer development  Radiographic exam RT foot 06/22/2022: Normal osseous mineralization.  No degenerative changes or cortical irregularities concerning for osteomyelitis.  Metatarsus adductus  noted  Assessment: 1.  Ulcers left second toe secondary to diabetes mellitus; resolved 2. diabetes mellitus w/ peripheral neuropathy.  Last A1c 05/12/2022 5.9 3.  Preulcerative callus lesions bilateral feet -Patient evaluated -Excisional debridement of the hyperkeratotic preulcerative callus tissue was performed using a 312 scalpel without incident or bleeding.  Patient felt significant relief - Medically necessary excisional debridement including subcutaneous tissue was performed to the left second toe ulcer using a tissue nipper.  Excisional debridement of the necrotic nonviable tissue down to healthier bleeding viable tissue was performed with postdebridement regimen same as pre-. -Continue gentamicin cream and a Band-Aid -Appointment with diabetic shoe department for custom molded diabetic insoles and shoes -Return to clinic 2 months  Edrick Kins, DPM [facility]  Dr. Edrick Kins, DPM    [address] [place], [place] [address]                Office [phone]  Fax [phone]

## 2022-11-16 NOTE — Telephone Encounter (Signed)
Patient never called back.

## 2022-11-22 ENCOUNTER — Other Ambulatory Visit: Payer: Medicare PPO

## 2022-11-24 ENCOUNTER — Encounter: Payer: Self-pay | Admitting: Allergy and Immunology

## 2022-11-24 ENCOUNTER — Other Ambulatory Visit: Payer: Self-pay

## 2022-11-24 MED ORDER — ALBUTEROL SULFATE HFA 108 (90 BASE) MCG/ACT IN AERS: INHALATION_SPRAY | RESPIRATORY_TRACT | 0 refills | Status: DC

## 2022-11-24 MED ORDER — QVAR REDIHALER 80 MCG/ACT IN AERB: INHALATION_SPRAY | RESPIRATORY_TRACT | 0 refills | Status: DC

## 2022-12-09 ENCOUNTER — Emergency Department (HOSPITAL_BASED_OUTPATIENT_CLINIC_OR_DEPARTMENT_OTHER)
Admission: EM | Admit: 2022-12-09 | Discharge: 2022-12-09 | Disposition: A | Discharge status: Home / Self Care | Payer: Medicare PPO | Attending: Emergency Medicine | Admitting: Emergency Medicine

## 2022-12-09 ENCOUNTER — Emergency Department (HOSPITAL_BASED_OUTPATIENT_CLINIC_OR_DEPARTMENT_OTHER): Payer: Medicare PPO

## 2022-12-09 ENCOUNTER — Other Ambulatory Visit: Payer: Self-pay

## 2022-12-09 ENCOUNTER — Other Ambulatory Visit (HOSPITAL_BASED_OUTPATIENT_CLINIC_OR_DEPARTMENT_OTHER): Payer: Self-pay

## 2022-12-09 ENCOUNTER — Encounter (HOSPITAL_BASED_OUTPATIENT_CLINIC_OR_DEPARTMENT_OTHER): Payer: Self-pay

## 2022-12-09 DIAGNOSIS — N390 Urinary tract infection, site not specified: Secondary | ICD-10-CM | POA: Insufficient documentation

## 2022-12-09 DIAGNOSIS — R1032 Left lower quadrant pain: Secondary | ICD-10-CM

## 2022-12-09 LAB — COMPREHENSIVE METABOLIC PANEL
ALT: 13 U/L (ref 0–44)
AST: 17 U/L (ref 15–41)
Albumin: 4.2 g/dL (ref 3.5–5.0)
Alkaline Phosphatase: 96 U/L (ref 38–126)
Anion gap: 9 (ref 5–15)
BUN: 7 mg/dL (ref 6–20)
CO2: 26 mmol/L (ref 22–32)
Calcium: 9.8 mg/dL (ref 8.9–10.3)
Chloride: 100 mmol/L (ref 98–111)
Creatinine, Ser: 0.58 mg/dL (ref 0.44–1.00)
GFR, Estimated: >60 mL/min (ref >60)
Glucose, Bld: 119 mg/dL — ABNORMAL HIGH (ref 70–99)
Potassium: 3.8 mmol/L (ref 3.5–5.1)
Sodium: 135 mmol/L (ref 135–145)
Total Bilirubin: 0.5 mg/dL (ref 0.3–1.2)
Total Protein: 7.6 g/dL (ref 6.5–8.1)

## 2022-12-09 LAB — URINALYSIS, ROUTINE W REFLEX MICROSCOPIC
Bacteria, UA: NONE SEEN
Bilirubin Urine: NEGATIVE
Glucose, UA: NEGATIVE mg/dL
Hgb urine dipstick: NEGATIVE
Nitrite: NEGATIVE
Specific Gravity, Urine: 1.022 (ref 1.005–1.030)
pH: 6.5 (ref 5.0–8.0)

## 2022-12-09 LAB — CBC
HCT: 44 % (ref 36.0–46.0)
Hemoglobin: 15.1 g/dL — ABNORMAL HIGH (ref 12.0–15.0)
MCH: 29.6 pg (ref 26.0–34.0)
MCHC: 34.3 g/dL (ref 30.0–36.0)
MCV: 86.3 fL (ref 80.0–100.0)
Platelets: 283 10*3/uL (ref 150–400)
RBC: 5.1 MIL/uL (ref 3.87–5.11)
RDW: 13.5 % (ref 11.5–15.5)
WBC: 8.3 10*3/uL (ref 4.0–10.5)
nRBC: 0 % (ref 0.0–0.2)

## 2022-12-09 LAB — LIPASE, BLOOD: Lipase: 24 U/L (ref 11–51)

## 2022-12-09 MED ORDER — ACETAMINOPHEN 325 MG PO TABS
650.0000 mg | ORAL_TABLET | Freq: Once | ORAL | Status: AC
  Administered 2022-12-09: 650 mg via ORAL
  Filled 2022-12-09: qty 2

## 2022-12-09 MED ORDER — IOHEXOL 300 MG/ML  SOLN
100.0000 mL | Freq: Once | INTRAMUSCULAR | Status: AC | PRN
  Administered 2022-12-09: 85 mL via INTRAVENOUS

## 2022-12-09 MED ORDER — CEFDINIR 300 MG PO CAPS
300.0000 mg | ORAL_CAPSULE | Freq: Two times a day (BID) | ORAL | 0 refills | Status: DC
  Filled 2022-12-09: qty 10, 5d supply, fill #0

## 2022-12-09 NOTE — ED Notes (Signed)
Pt requesting tylenol for headache. Geiple PA made aware

## 2022-12-09 NOTE — Discharge Instructions (Addendum)
Please read and follow all provided instructions.  Your diagnoses today include:  1. Left lower quadrant abdominal pain   2. Urinary tract infection without hematuria, site unspecified     Tests performed today include: Blood cell counts and platelets Kidney and liver function tests Pancreas function test (called lipase) Urine test to look for infection CT scan of your abdomen and pelvis: Did not show any significant problems today Vital signs. See below for your results today.   Medications prescribed:  Cefdinir: Antibiotic for UTI  Take any prescribed medications only as directed.  Home care instructions:  Follow any educational materials contained in this packet.  Follow-up instructions: Please follow-up with your primary care provider and GI as planned for further evaluation of your symptoms.    Return instructions:  SEEK IMMEDIATE MEDICAL ATTENTION IF: The pain does not go away or becomes severe  A temperature above 101F develops  Repeated vomiting occurs (multiple episodes)  The pain becomes localized to portions of the abdomen. The right side could possibly be appendicitis. In an adult, the left lower portion of the abdomen could be colitis or diverticulitis.  Blood is being passed in stools or vomit (bright red or black tarry stools)  You develop chest pain, difficulty breathing, dizziness or fainting, or become confused, poorly responsive, or inconsolable (young children) If you have any other emergent concerns regarding your health  Additional Information: Abdominal (belly) pain can be caused by many things. Your caregiver performed an examination and possibly ordered blood/urine tests and imaging (CT scan, x-rays, ultrasound). Many cases can be observed and treated at home after initial evaluation in the emergency department. Even though you are being discharged home, abdominal pain can be unpredictable. Therefore, you need a repeated exam if your pain does not  resolve, returns, or worsens. Most patients with abdominal pain don't have to be admitted to the hospital or have surgery, but serious problems like appendicitis and gallbladder attacks can start out as nonspecific pain. Many abdominal conditions cannot be diagnosed in one visit, so follow-up evaluations are very important.  Your vital signs today were: BP 139/88   Pulse (!) 105   Temp 98.7 F (37.1 C)   Resp 17   Ht 5\' 9"  (1.753 m)   Wt 95.3 kg   LMP 04/06/2014   SpO2 98%   BMI 31.01 kg/m  If your blood pressure (bp) was elevated above 135/85 this visit, please have this repeated by your doctor within one month. --------------

## 2022-12-09 NOTE — ED Notes (Signed)
RN reviewed discharge instructions with pt. Pt verbalized understanding and had no further questions. VSS upon discharge.  

## 2022-12-09 NOTE — ED Triage Notes (Signed)
Patient here POV from Home.  Endorse Fever that began Last PM. Associated with a Lower Left ABD Pain (almost Lateral) that began today. 101 Temp Last PM.   States she has been dealing with "Constipation and other GI Problems" for over a Month. Some nausea. No emesis. No Recent Diarrhea.   NAD noted during Triage. A&Ox4. GCS 15. Ambulatory.

## 2022-12-09 NOTE — ED Provider Notes (Signed)
[facility] EMERGENCY DEPARTMENT AT [facility] Provider Note   CSN: [id number] Arrival date & time: 12/09/22  1320     History  Chief Complaint  Patient presents with   Abdominal Pain    Megan Lynn is a 52 y.o. female.  Patient presents to the emergency department today for evaluation of abdominal pain.  Patient has no previous GI surgeries or colonoscopies.  She reports having an episode of "norovirus" lasting approximately 4 to 5 days in February.  Since that time she has had approximately 5 additional episodes of similar symptoms.  This includes fevers, nausea and vomiting, alternating diarrhea and constipation.  Symptoms late improved in between these episodes.  The left lower quadrant and left flank pain started last night.  This is atypical for her symptoms.  She also reports a "worse" fever overnight which broke this morning.  No treatments prior to arrival.  Last bowel movement was about a week ago.  She denies urinary symptoms.  No recent vomiting but she has been very nauseous.  States that she received a GI referral yesterday to be seen for colonoscopy.       Home Medications Prior to Admission medications   Medication Sig Start Date End Date Taking? Authorizing Provider  albuterol (PROAIR HFA) 108 (90 Base) MCG/ACT inhaler Can inhale two puffs every four to six hours as needed for cough, wheeze, or shortness of breath. 11/24/22   Kozlow, Alvira Philips, MD  amoxicillin-clavulanate (AUGMENTIN) 875-125 MG tablet Take 1 tablet by mouth 2 (two) times daily. 06/22/22   Felecia Shelling, DPM  AUVI-Q 0.3 MG/0.3ML SOAJ injection Use as directed for life-threatening allergic reaction. 07/17/18   Kozlow, Alvira Philips, MD  beclomethasone (QVAR REDIHALER) 80 MCG/ACT inhaler Inhale three puffs three times daily during asthma flare-up.  Rinse, gargle, and spit after use. 11/24/22   Kozlow, Alvira Philips, MD  cetirizine (ZYRTEC) 10 MG tablet Take 10 mg by mouth daily.    [provider]   Cholecalciferol (VITAMIN D3 PO) Take by mouth daily.    [provider]  fluticasone (FLONASE) 50 MCG/ACT nasal spray Place 1 spray into both nostrils 2 (two) times daily. 05/25/17   Kozlow, Alvira Philips, MD  gentamicin cream (GARAMYCIN) 0.1 % APPLY  CREAM TOPICALLY TO AFFECTED AREA TWICE DAILY 06/22/22   Felecia Shelling, DPM  hydrOXYzine (ATARAX) 25 MG tablet Take 1 tablet by mouth twice daily 09/13/22   Kozlow, Alvira Philips, MD  metFORMIN (GLUCOPHAGE-XR) 500 MG 24 hr tablet TAKE 1 TABLET ONCE DAILY FOR A WEEK THEN TAKE 2 TABLETS DAILY 03/14/18   [provider]  mometasone (ELOCON) 0.1 % cream APPLY  CREAM TOPICALLY TO AFFECTED AREA ONCE DAILY AS NEEDED 10/19/21   Kozlow, Alvira Philips, MD  MOUNJARO 10 MG/0.5ML Pen SMARTSIG:10 Milligram(s) SUB-Q Once a Week 04/02/22   [provider]  OPZELURA 1.5 % CREA Apply 1 application  topically 2 (two) times daily. 08/25/22   Kozlow, Alvira Philips, MD      Allergies    Ciprofloxacin, Erythromycin, Shellfish allergy, Sulfa antibiotics, and Doxycycline    Review of Systems   Review of Systems  Physical Exam Updated Vital Signs BP 131/85 (BP Location: Right Arm)   Pulse (!) 116   Temp 98.7 F (37.1 C)   Resp 18   Ht 5\' 9"  (1.753 m)   Wt 95.3 kg   LMP 04/06/2014   SpO2 100%   BMI 31.01 kg/m   Physical Exam Vitals and nursing note  reviewed.  Constitutional:      General: She is not in acute distress.    Appearance: She is well-developed.  HENT:     Head: Normocephalic and atraumatic.     Right Ear: External ear normal.     Left Ear: External ear normal.     Nose: Nose normal.  Eyes:     Conjunctiva/sclera: Conjunctivae normal.  Cardiovascular:     Rate and Rhythm: Normal rate and regular rhythm.     Heart sounds: No murmur heard. Pulmonary:     Effort: No respiratory distress.     Breath sounds: No wheezing, rhonchi or rales.  Abdominal:     Palpations: Abdomen is soft.     Tenderness: There is abdominal tenderness in the left lower  quadrant. There is no guarding or rebound. Negative signs include Murphy's sign and McBurney's sign.     Comments: Mild generalized tenderness with moderate tenderness in the left lower quadrant and left flank, poorly localized  Musculoskeletal:     Cervical back: Normal range of motion and neck supple.     Right lower leg: No edema.     Left lower leg: No edema.  Skin:    General: Skin is warm and dry.     Findings: No rash.  Neurological:     General: No focal deficit present.     Mental Status: She is alert. Mental status is at baseline.     Motor: No weakness.  Psychiatric:        Mood and Affect: Mood normal.     ED Results / Procedures / Treatments   Labs (all labs ordered are listed, but only abnormal results are displayed) Labs Reviewed  COMPREHENSIVE METABOLIC PANEL - Abnormal; Notable for the following components:      Result Value   Glucose, Bld 119 (*)    All other components within normal limits  CBC - Abnormal; Notable for the following components:   Hemoglobin 15.1 (*)    All other components within normal limits  URINALYSIS, ROUTINE W REFLEX MICROSCOPIC - Abnormal; Notable for the following components:   Ketones, ur TRACE (*)    Protein, ur TRACE (*)    Leukocytes,Ua MODERATE (*)    Non Squamous Epithelial 0-5 (*)    All other components within normal limits  LIPASE, BLOOD    EKG None  Radiology CT ABDOMEN PELVIS W CONTRAST  Result Date: 12/09/2022 CLINICAL DATA:  Abdominal pain, left flank pain EXAM: CT ABDOMEN AND PELVIS WITH CONTRAST TECHNIQUE: Multidetector CT imaging of the abdomen and pelvis was performed using the standard protocol following bolus administration of intravenous contrast. RADIATION DOSE REDUCTION: This exam was performed according to the departmental dose-optimization program which includes automated exposure control, adjustment of the mA and/or kV according to patient size and/or use of iterative reconstruction technique. CONTRAST:   59mL OMNIPAQUE IOHEXOL 300 MG/ML  SOLN COMPARISON:  None Available. FINDINGS: Lower chest: Visualized lower lung fields are clear. Hepatobiliary: No focal abnormalities are seen. There is no dilation of bile ducts. Gallbladder is distended. There is no wall thickening. Pancreas: No focal abnormalities are seen. Spleen: Unremarkable. Adrenals/Urinary Tract: Adrenals are unremarkable. There is no hydronephrosis. There are no renal or ureteral stones. Urinary bladder is unremarkable. Stomach/Bowel: Stomach is not distended. Small bowel loops are unremarkable. Appendix is not dilated. There is no significant wall thickening in colon. There is no pericolic stranding. Vascular/Lymphatic: Unremarkable. Reproductive: Unremarkable. Other: There is no ascites or pneumoperitoneum. Small umbilical hernia containing  fat is seen. Musculoskeletal: No acute findings are seen. IMPRESSION: There is no evidence of intestinal obstruction or pneumoperitoneum. There is no hydronephrosis. The appendix is not dilated. Electronically Signed   By: Ernie Avena M.D.   On: 12/09/2022 16:25    Procedures Procedures    Medications Ordered in ED Medications - No data to display  ED Course/ Medical Decision Making/ A&P    Patient seen and examined. History obtained directly from patient.   Labs/EKG: Ordered CBC, CMP, lipase, UA.  Imaging: Ordered CT abdomen pelvis.  Medications/Fluids: Ordered: Fluid bolus, patient declines pain and nausea medications at this time.   Most recent vital signs reviewed and are as follows: BP 131/85 (BP Location: Right Arm)   Pulse (!) 116   Temp 98.7 F (37.1 C)   Resp 18   Ht 5\' 9"  (1.753 m)   Wt 95.3 kg   LMP 04/06/2014   SpO2 100%   BMI 31.01 kg/m   Initial impression: Generalized abdominal tenderness, worse on the left, intermittent symptoms since February.  4:53 PM Reassessment performed. Patient appears stable, comfortable.  She was given some Tylenol earlier for  headache.  Labs personally reviewed and interpreted including: CBC with normal white blood cell count, hemoglobin mildly elevated at 15.1; CMP with minimally elevated glucose with normal anion gap otherwise unremarkable; lipase normal; UA is a clean-catch with some white blood cells.  Imaging personally visualized and interpreted including: CT abdomen pelvis, agree no acute findings.  Reviewed pertinent lab work and imaging with patient at bedside. Questions answered.   Most current vital signs reviewed and are as follows: BP 139/88   Pulse (!) 105   Temp 98.7 F (37.1 C)   Resp 17   Ht 5\' 9"  (1.753 m)   Wt 95.3 kg   LMP 04/06/2014   SpO2 98%   BMI 31.01 kg/m   Plan: Discharge to home.   Prescriptions written for: Cefdinir, given flank pain, UA findings, and no other identifiable cause of the patient's symptoms, will give 5-day course for treatment of UTI.  Other home care instructions discussed:   ED return instructions discussed: The patient was urged to return to the Emergency Department immediately with worsening of current symptoms, worsening abdominal pain, persistent vomiting, blood noted in stools, fever, or any other concerns. The patient verbalized understanding.   Follow-up instructions discussed: Patient encouraged to follow-up with their PCP/GI as planned.                             Medical Decision Making Amount and/or Complexity of Data Reviewed Labs: ordered. Radiology: ordered.  Risk OTC drugs. Prescription drug management.   For this patient's complaint of abdominal pain, the following conditions were considered on the differential diagnosis: gastritis/PUD, enteritis/duodenitis, appendicitis, cholelithiasis/cholecystitis, cholangitis, pancreatitis, ruptured viscus, colitis, diverticulitis, small/large bowel obstruction, proctitis, cystitis, pyelonephritis, ureteral colic, aortic dissection, aortic aneurysm. In women, ectopic pregnancy, pelvic inflammatory  disease, ovarian cysts, and tubo-ovarian abscess were also considered. Atypical chest etiologies were also considered including ACS, PE, and pneumonia.  The patient's vital signs, pertinent lab work and imaging were reviewed and interpreted as discussed in the ED course. Hospitalization was considered for further testing, treatments, or serial exams/observation. However as patient is well-appearing, has a stable exam, and reassuring studies today, I do not feel that they warrant admission at this time. This plan was discussed with the patient who verbalizes agreement and comfort with this plan and  seems reliable and able to return to the Emergency Department with worsening or changing symptoms.          Final Clinical Impression(s) / ED Diagnoses Final diagnoses:  Left lower quadrant abdominal pain  Urinary tract infection without hematuria, site unspecified    Rx / DC Orders ED Discharge Orders          Ordered    cefdinir (OMNICEF) 300 MG capsule  2 times daily        12/09/22 1651              Renne CriglerGeiple, Srinika Delone, PA-C 12/09/22 1655    [address], [place], [place] 12/10/22 [phone]

## 2022-12-14 NOTE — Progress Notes (Signed)
[facility] Consult Note:  History: Megan Lynn 12/15/2022  Referring provider: [name], MD  Reason for consult/chief complaint: No chief complaint on file.  Subjective  HPI: Patient has had an ED visit on 12-09-22. Per that note, she was experiencing LLQ pain accompanied by nausea, vomiting, and alternating constipation and diarrhea. She was prescribed Cefdinir 300 mg 2x daily. She has no previous GI surgeries or colonoscopies.    Patient presents to clinic today for an evaluation of diarrhea and vomiting per the request of Dr. [name].   Today,    ROS: Review of Systems   Past Medical History: Past Medical History:  Diagnosis Date   Asthma    Diabetes    Eczema    Food allergy      Past Surgical History: Past Surgical History:  Procedure Laterality Date   GLAUCOMA SURGERY     TOE SURGERY Left 04/2018   TOE SURGERY Left    TYMPANOSTOMY TUBE PLACEMENT       Family History: Family History  Problem Relation Age of Onset   Diabetes Mother     Social History: Social History   Socioeconomic History   Marital status: Married    Spouse name: Not on file   Number of children: Not on file   Years of education: Not on file   Highest education level: Not on file  Occupational History   Not on file  Tobacco Use   Smoking status: Never   Smokeless tobacco: Never  Substance and Sexual Activity   Alcohol use: Yes    Comment: rare   Drug use: No   Sexual activity: Yes    Birth control/protection: None  Other Topics Concern   Not on file  Social History Narrative   Not on file   Social Determinants of Health   Financial Resource Strain: Not on file  Food Insecurity: Not on file  Transportation Needs: Not on file  Physical Activity: Not on file  Stress: Not on file  Social Connections: Not on file    Allergies: Allergies  Allergen Reactions   Ciprofloxacin Hives and Shortness Of Breath   Erythromycin  Shortness Of Breath and Rash   Shellfish Allergy Shortness Of Breath and Rash   Sulfa Antibiotics Shortness Of Breath, Rash and Anaphylaxis   Doxycycline Nausea Only and Other (See Comments)    Horrible heart burn    Outpatient Meds: Current Outpatient Medications  Medication Sig Dispense Refill   albuterol (PROAIR HFA) 108 (90 Base) MCG/ACT inhaler Can inhale two puffs every four to six hours as needed for cough, wheeze, or shortness of breath. 1 each 0   AUVI-Q 0.3 MG/0.3ML SOAJ injection Use as directed for life-threatening allergic reaction. 4 Device 1   beclomethasone (QVAR REDIHALER) 80 MCG/ACT inhaler Inhale three puffs three times daily during asthma flare-up.  Rinse, gargle, and spit after use. 1 each 0   cefdinir (OMNICEF) 300 MG capsule Take 1 capsule (300 mg total) by mouth 2 (two) times daily. 10 capsule 0   cetirizine (ZYRTEC) 10 MG tablet Take 10 mg by mouth daily.     Cholecalciferol (VITAMIN D3 PO) Take by mouth daily.     fluticasone (FLONASE) 50 MCG/ACT nasal spray Place 1 spray into both nostrils 2 (two) times daily. 16 g 5   gentamicin cream (GARAMYCIN) 0.1 % APPLY  CREAM TOPICALLY TO AFFECTED AREA TWICE DAILY 30 g 1   hydrOXYzine (ATARAX) 25 MG tablet Take 1 tablet by mouth  twice daily 60 tablet 5   metFORMIN (GLUCOPHAGE-XR) 500 MG 24 hr tablet TAKE 1 TABLET ONCE DAILY FOR A WEEK THEN TAKE 2 TABLETS DAILY     mometasone (ELOCON) 0.1 % cream APPLY  CREAM TOPICALLY TO AFFECTED AREA ONCE DAILY AS NEEDED 150 g 3   MOUNJARO 10 MG/0.5ML Pen SMARTSIG:10 Milligram(s) SUB-Q Once a Week     OPZELURA 1.5 % CREA Apply 1 application  topically 2 (two) times daily. 60 g 5   No current facility-administered medications for this visit.      ___________________________________________________________________ Objective   Exam:  LMP 04/06/2014  Wt Readings from Last 3 Encounters:  12/09/22 210 lb (95.3 kg)  04/19/22 235 lb 6.4 oz (106.8 kg)  10/19/21 243 lb 6.4 oz (110.4 kg)     General: well-appearing ***  Eyes: sclera anicteric, no redness ENT: oral mucosa moist without lesions, no cervical or supraclavicular lymphadenopathy CV: RRR, no JVD, no peripheral edema Resp: clear to auscultation bilaterally, normal RR and effort noted GI: soft, no tenderness, with active bowel sounds. No guarding or palpable organomegaly noted. Skin; warm and dry, no rash or jaundice noted Neuro: awake, alert and oriented x 3. Normal gross motor function and fluent speech  Labs:     Latest Ref Rng & Units 12/09/2022    1:29 PM  CBC  WBC 4.0 - 10.5 K/uL 8.3   Hemoglobin 12.0 - 15.0 g/dL 16.1   Hematocrit 09.6 - 46.0 % 44.0   Platelets 150 - 400 K/uL 283       Latest Ref Rng & Units 12/09/2022    1:29 PM  CMP  Glucose 70 - 99 mg/dL 045   BUN 6 - 20 mg/dL 7   Creatinine 4.09 - 8.11 mg/dL 9.14   Sodium 782 - 956 mmol/L 135   Potassium 3.5 - 5.1 mmol/L 3.8   Chloride 98 - 111 mmol/L 100   CO2 22 - 32 mmol/L 26   Calcium 8.9 - 10.3 mg/dL 9.8   Total Protein 6.5 - 8.1 g/dL 7.6   Total Bilirubin 0.3 - 1.2 mg/dL 0.5   Alkaline Phos 38 - 126 U/L 96   AST 15 - 41 U/L 17   ALT 0 - 44 U/L 13      Radiologic Studies: CT ABDOMEN AND PELVIS WITH CONTRAST 12-09-22   TECHNIQUE: Multidetector CT imaging of the abdomen and pelvis was performed using the standard protocol following bolus administration of intravenous contrast.   RADIATION DOSE REDUCTION: This exam was performed according to the departmental dose-optimization program which includes automated exposure control, adjustment of the mA and/or kV according to patient size and/or use of iterative reconstruction technique.   CONTRAST:  85mL OMNIPAQUE IOHEXOL 300 MG/ML  SOLN   COMPARISON:  None Available.   FINDINGS: Lower chest: Visualized lower lung fields are clear.   Hepatobiliary: No focal abnormalities are seen. There is no dilation of bile ducts. Gallbladder is distended. There is no wall thickening.    Pancreas: No focal abnormalities are seen.   Spleen: Unremarkable.   Adrenals/Urinary Tract: Adrenals are unremarkable. There is no hydronephrosis. There are no renal or ureteral stones. Urinary bladder is unremarkable.   Stomach/Bowel: Stomach is not distended. Small bowel loops are unremarkable. Appendix is not dilated. There is no significant wall thickening in colon. There is no pericolic stranding.   Vascular/Lymphatic: Unremarkable.   Reproductive: Unremarkable.   Other: There is no ascites or pneumoperitoneum. Small umbilical hernia containing fat is seen.  Musculoskeletal: No acute findings are seen.   IMPRESSION: There is no evidence of intestinal obstruction or pneumoperitoneum. There is no hydronephrosis. The appendix is not dilated.    Assessment:  No diagnosis found.  ***   Plan: ***   Thank you for the courtesy of this consult.  Please call me with any questions or concerns.  Derrill Kay  CC: Referring provider noted above   I,Safa M Kadhim,acting as a scribe for Charlie Pitter III, MD.,have documented all relevant documentation on the behalf of Sherrilyn Rist, MD,as directed by  Sherrilyn Rist, MD while in the presence of Sherrilyn Rist, MD.

## 2022-12-15 ENCOUNTER — Ambulatory Visit: Payer: Medicare PPO | Admitting: Gastroenterology

## 2022-12-15 ENCOUNTER — Other Ambulatory Visit: Payer: No Typology Code available for payment source

## 2022-12-15 ENCOUNTER — Encounter: Payer: Self-pay | Admitting: Gastroenterology

## 2022-12-15 VITALS — BP 124/80 | HR 70 | Ht 69.0 in | Wt 222.4 lb

## 2022-12-15 DIAGNOSIS — K529 Noninfective gastroenteritis and colitis, unspecified: Secondary | ICD-10-CM

## 2022-12-15 DIAGNOSIS — R112 Nausea with vomiting, unspecified: Secondary | ICD-10-CM

## 2022-12-15 DIAGNOSIS — R1010 Upper abdominal pain, unspecified: Secondary | ICD-10-CM

## 2022-12-15 NOTE — Patient Instructions (Addendum)
_______________________________________________________  If your blood pressure at your visit was 140/90 or greater, please contact your primary care physician to follow up on this.  _______________________________________________________  If you are age 52 or older, your body mass index should be between 23-30. Your Body mass index is 32.84 kg/m. If this is out of the aforementioned range listed, please consider follow up with your Primary Care Provider.  If you are age 88 or younger, your body mass index should be between 19-25. Your Body mass index is 32.84 kg/m. If this is out of the aformentioned range listed, please consider follow up with your Primary Care Provider.   ________________________________________________________  The [facility] providers would like to encourage you to use [facility] to communicate with providers for non-urgent requests or questions.  Due to long hold times on the telephone, sending your provider a message by [facility] may be a faster and more efficient way to get a response.  Please allow 48 business hours for a response.  Please remember that this is for non-urgent requests.  _______________________________________________________  Your provider has requested that you go to the basement level for lab work before leaving today. Press "B" on the elevator. The lab is located at the first door on the left as you exit the elevator.  You have been scheduled for an endoscopy and colonoscopy. Please follow the written instructions given to you at your visit today. Please pick up your prep supplies at the pharmacy within the next 1-3 days. If you use inhalers (even only as needed), please bring them with you on the day of your procedure.  You have been scheduled for an abdominal ultrasound at [facility] (1st floor of hospital) on 12-16-2022 at 9:15am. Please arrive 15 minutes prior to your appointment for registration. Make certain not to have anything to eat or  drink 6 hours prior to your appointment. Should you need to reschedule your appointment, please contact radiology at [phone]. This test typically takes about 30 minutes to perform.   Due to recent changes in healthcare laws, you may see the results of your imaging and laboratory studies on MyChart before your provider has had a chance to review them.  We understand that in some cases there may be results that are confusing or concerning to you. Not all laboratory results come back in the same time frame and the provider may be waiting for multiple results in order to interpret others.  Please give [place] 48 hours in order for your provider to thoroughly review all the results before contacting the office for clarification of your results.   It was a pleasure to see you today!  Thank you for trusting me with your gastrointestinal care!

## 2022-12-16 ENCOUNTER — Ambulatory Visit (HOSPITAL_COMMUNITY)
Admission: RE | Admit: 2022-12-16 | Discharge: 2022-12-16 | Disposition: A | Discharge status: Home / Self Care | Payer: Medicare PPO | Source: Ambulatory Visit | Attending: Gastroenterology | Admitting: Gastroenterology

## 2022-12-16 DIAGNOSIS — K529 Noninfective gastroenteritis and colitis, unspecified: Secondary | ICD-10-CM | POA: Diagnosis present

## 2022-12-16 DIAGNOSIS — R112 Nausea with vomiting, unspecified: Secondary | ICD-10-CM | POA: Diagnosis not present

## 2022-12-16 LAB — TISSUE TRANSGLUTAMINASE, IGA: (tTG) Ab, IgA: <1 U/mL

## 2022-12-16 LAB — IGA: Immunoglobulin A: 354 mg/dL — ABNORMAL HIGH (ref 47–310)

## 2022-12-23 ENCOUNTER — Other Ambulatory Visit: Payer: Self-pay | Admitting: Podiatry

## 2022-12-23 DIAGNOSIS — M2042 Other hammer toe(s) (acquired), left foot: Secondary | ICD-10-CM

## 2022-12-23 DIAGNOSIS — E0843 Diabetes mellitus due to underlying condition with diabetic autonomic (poly)neuropathy: Secondary | ICD-10-CM

## 2022-12-23 DIAGNOSIS — L97522 Non-pressure chronic ulcer of other part of left foot with fat layer exposed: Secondary | ICD-10-CM

## 2022-12-29 ENCOUNTER — Other Ambulatory Visit: Payer: Self-pay

## 2022-12-29 ENCOUNTER — Telehealth: Payer: Self-pay | Admitting: Gastroenterology

## 2022-12-29 MED ORDER — NA SULFATE-K SULFATE-MG SULF 17.5-3.13-1.6 GM/177ML PO SOLN: 1.0000 | Freq: Once | ORAL | 0 refills | Status: AC

## 2022-12-29 NOTE — Telephone Encounter (Signed)
PT is calling to have Suprep sent to pharmacy. It should go to [facility] on [address]. Requesting call back once sent.

## 2022-12-29 NOTE — Telephone Encounter (Signed)
Rx has been resent to [facility] as requested.

## 2023-01-02 ENCOUNTER — Encounter: Payer: Self-pay | Admitting: Certified Registered Nurse Anesthetist

## 2023-01-06 ENCOUNTER — Ambulatory Visit: Payer: Medicare PPO | Admitting: Gastroenterology

## 2023-01-06 ENCOUNTER — Encounter: Payer: Self-pay | Admitting: Gastroenterology

## 2023-01-06 VITALS — BP 125/76 | HR 88 | Temp 97.8°F | Resp 10 | Ht 69.0 in | Wt 222.0 lb

## 2023-01-06 DIAGNOSIS — D123 Benign neoplasm of transverse colon: Secondary | ICD-10-CM

## 2023-01-06 DIAGNOSIS — R1010 Upper abdominal pain, unspecified: Secondary | ICD-10-CM

## 2023-01-06 DIAGNOSIS — Z1211 Encounter for screening for malignant neoplasm of colon: Secondary | ICD-10-CM | POA: Diagnosis not present

## 2023-01-06 DIAGNOSIS — D120 Benign neoplasm of cecum: Secondary | ICD-10-CM

## 2023-01-06 DIAGNOSIS — K529 Noninfective gastroenteritis and colitis, unspecified: Secondary | ICD-10-CM

## 2023-01-06 DIAGNOSIS — R112 Nausea with vomiting, unspecified: Secondary | ICD-10-CM | POA: Diagnosis not present

## 2023-01-06 DIAGNOSIS — K2950 Unspecified chronic gastritis without bleeding: Secondary | ICD-10-CM

## 2023-01-06 MED ORDER — SODIUM CHLORIDE 0.9 % IV SOLN
500.0000 mL | Freq: Once | INTRAVENOUS | Status: DC
Start: 2023-01-06 — End: 2023-01-06

## 2023-01-06 NOTE — Progress Notes (Signed)
No changes to clinical history since GI office visit on 12/15/22. Gallstones later discovered on ultrasound. The patient is appropriate for an endoscopic procedure in the ambulatory setting.  - Amada Jupiter, MD

## 2023-01-06 NOTE — Patient Instructions (Signed)
Refer to General Surgery for consideration of                            cholecystectomy.                           (stones on recent ultrasound, episodic symptoms, no                            visible explanation on today's procedures)                           Patient would like to see the same surgeon that saw                            her husband, and she will provide the name and                            contact information for our office to send a                            referral.  YOU HAD AN ENDOSCOPIC PROCEDURE TODAY: Refer to the procedure report and other information in the discharge instructions given to you for any specific questions about what was found during the examination. If this information does not answer your questions, please call [place] office at [phone] to clarify.   YOU SHOULD EXPECT: Some feelings of bloating in the abdomen. Passage of more gas than usual. Walking can help get rid of the air that was put into your GI tract during the procedure and reduce the bloating. If you had a lower endoscopy (such as a colonoscopy or flexible sigmoidoscopy) you may notice spotting of blood in your stool or on the toilet paper. Some abdominal soreness may be present for a day or two, also.  DIET: Your first meal following the procedure should be a light meal and then it is ok to progress to your normal diet. A half-sandwich or bowl of soup is an example of a good first meal. Heavy or fried foods are harder to digest and may make you feel nauseous or bloated. Drink plenty of fluids but you should avoid alcoholic beverages for 24 hours. If you had a esophageal dilation, please see attached instructions for diet.    ACTIVITY: Your care partner should take you home directly after the procedure. You should plan to take it easy, moving slowly for the rest of the day. You can resume normal activity the day after the procedure however YOU SHOULD NOT DRIVE, use power tools, machinery  or perform tasks that involve climbing or major physical exertion for 24 hours (because of the sedation medicines used during the test).   SYMPTOMS TO REPORT IMMEDIATELY: A gastroenterologist can be reached at any hour. Please call [phone]  for any of the following symptoms:  Following lower endoscopy (colonoscopy, flexible sigmoidoscopy) Excessive amounts of blood in the stool  Significant tenderness, worsening of abdominal pains  Swelling of the abdomen that is new, acute  Fever of 100 or higher  Following upper endoscopy (EGD, EUS, ERCP, esophageal dilation) Vomiting of blood or coffee ground material  New, significant abdominal pain  New, significant chest pain  or pain under the shoulder blades  Painful or persistently difficult swallowing  New shortness of breath  Black, tarry-looking or red, bloody stools  FOLLOW UP:  If any biopsies were taken you will be contacted by phone or by letter within the next 1-3 weeks. Call [phone]  if you have not heard about the biopsies in 3 weeks.  Please also call with any specific questions about appointments or follow up tests.

## 2023-01-06 NOTE — Progress Notes (Signed)
1405 Robinul 0.1 mg IV given due large amount of secretions upon assessment.  MD made aware, vss  

## 2023-01-06 NOTE — Op Note (Signed)
[facility] Patient Name: Calyx Serafini Procedure Date: 01/06/2023 2:05 PM MRN: [id number] Endoscopist: Sherilyn Cooter L. Myrtie Neither , MD, [id number] Age: 52 Referring MD:  Date of Birth: 1971/05/07 Gender: Female Account #: [id number] Procedure:                Upper GI endoscopy Indications:              Epigastric abdominal pain, Nausea with vomiting                           (episodic - see recent office consult note for                            details) Medicines:                Monitored Anesthesia Care Procedure:                Pre-Anesthesia Assessment:                           - Prior to the procedure, a History and Physical                            was performed, and patient medications and                            allergies were reviewed. The patient's tolerance of                            previous anesthesia was also reviewed. The risks                            and benefits of the procedure and the sedation                            options and risks were discussed with the patient.                            All questions were answered, and informed consent                            was obtained. Prior Anticoagulants: The patient has                            taken no anticoagulant or antiplatelet agents. ASA                            Grade Assessment: II - A patient with mild systemic                            disease. After reviewing the risks and benefits,                            the patient was deemed in satisfactory condition to  undergo the procedure.                           After obtaining informed consent, the endoscope was                            passed under direct vision. Throughout the                            procedure, the patient's blood pressure, pulse, and                            oxygen saturations were monitored continuously. The                            GIF HQ190 #[id number] was introduced through the                             mouth, and advanced to the second part of duodenum.                            The upper GI endoscopy was accomplished without                            difficulty. The patient tolerated the procedure                            well. Scope In: Scope Out: Findings:                 The larynx was normal.                           The esophagus was normal.                           Normal mucosa was found in the entire examined                            stomach (except for bile staining with a small                            amount of bilious fluid). Several biopsies were                            obtained on the greater curvature of the gastric                            body, on the lesser curvature of the gastric body,                            on the greater curvature of the gastric antrum and                            on the lesser curvature of the  gastric antrum with                            cold forceps for histology (r/o H pylori, both                            areas in same pathology jar).                           The examined duodenum was normal. Biopsies for                            histology were taken with a cold forceps for                            evaluation of celiac disease. Complications:            No immediate complications. Estimated Blood Loss:     Estimated blood loss was minimal. Impression:               - Normal larynx.                           - Normal esophagus.                           - Normal mucosa was found in the entire stomach.                           - Normal examined duodenum. Biopsied.                           - Several biopsies were obtained on the greater                            curvature of the gastric body, on the lesser                            curvature of the gastric body, on the greater                            curvature of the gastric antrum and on the lesser                            curvature  of the gastric antrum. Recommendation:           - Patient has a contact number available for                            emergencies. The signs and symptoms of potential                            delayed complications were discussed with the                            patient. Return to normal activities  tomorrow.                            Written discharge instructions were provided to the                            patient.                           - Resume previous diet.                           - Continue present medications.                           - Await pathology results.                           - See the other procedure note for documentation of                            additional recommendations.                           - Refer to General Surgery for consideration of                            cholecystectomy.                           (stones on recent ultrasound, episodic symptoms, no                            visible explanation on today's procedures)                           Patient would like to see the same surgeon that saw                            her husband, and she will provide the name and                            contact information for our office to send a                            referral. Sherilyn Cooter L. Myrtie Neither, MD 01/06/2023 2:51:46 PM This report has been signed electronically.

## 2023-01-06 NOTE — Op Note (Signed)
[facility] Patient Name: Megan Lynn Procedure Date: 01/06/2023 2:06 PM MRN: [id number] Endoscopist: Sherilyn Cooter L. Myrtie Neither , MD, [id number] Age: 52 Referring MD:  Date of Birth: [birth year] Gender: Female Account #: [id number] Procedure:                Colonoscopy Indications:              Screening for colorectal malignant neoplasm, This                            is the patient's first colonoscopy, Incidental                            diarrhea noted Medicines:                Monitored Anesthesia Care Procedure:                Pre-Anesthesia Assessment:                           - Prior to the procedure, a History and Physical                            was performed, and patient medications and                            allergies were reviewed. The patient's tolerance of                            previous anesthesia was also reviewed. The risks                            and benefits of the procedure and the sedation                            options and risks were discussed with the patient.                            All questions were answered, and informed consent                            was obtained. Prior Anticoagulants: The patient has                            taken no anticoagulant or antiplatelet agents. ASA                            Grade Assessment: II - A patient with mild systemic                            disease. After reviewing the risks and benefits,                            the patient was deemed in satisfactory condition to  undergo the procedure.                           After obtaining informed consent, the colonoscope                            was passed under direct vision. Throughout the                            procedure, the patient's blood pressure, pulse, and                            oxygen saturations were monitored continuously. The                            CF [id number] #[id number] was introduced through the  anus                            and advanced to the the terminal ileum, with                            identification of the appendiceal orifice and IC                            valve. The colonoscopy was performed without                            difficulty. The patient tolerated the procedure                            well. The quality of the bowel preparation was                            excellent. The terminal ileum, ileocecal valve,                            appendiceal orifice, and rectum were photographed. Scope In: 2:12:38 PM Scope Out: 2:32:51 PM Scope Withdrawal Time: 0 hours 15 minutes 57 seconds  Total Procedure Duration: 0 hours 20 minutes 13 seconds  Findings:                 The perianal and digital rectal examinations were                            normal.                           The terminal ileum appeared normal.                           Repeat examination of right colon under NBI                            performed.  Two sessile polyps were found in the transverse                            colon and cecum. The polyps were 3 to 5 mm in size.                            These polyps were removed with a cold snare.                            Resection and retrieval were complete.                           Normal mucosa was found in the entire colon.                            Biopsies for histology were taken with a cold                            forceps from the right colon and left colon for                            evaluation of microscopic colitis.                           The exam was otherwise without abnormality on                            direct and retroflexion views. Complications:            No immediate complications. Estimated Blood Loss:     Estimated blood loss was minimal. Impression:               - The examined portion of the ileum was normal.                           - Two 3 to 5 mm polyps in the transverse  colon and                            in the cecum, removed with a cold snare. Resected                            and retrieved.                           - Normal mucosa in the entire examined colon.                            Biopsied.                           - The examination was otherwise normal on direct                            and retroflexion views. Recommendation:           -  Patient has a contact number available for                            emergencies. The signs and symptoms of potential                            delayed complications were discussed with the                            patient. Return to normal activities tomorrow.                            Written discharge instructions were provided to the                            patient.                           - Resume previous diet.                           - Continue present medications.                           - Await pathology results.                           - Repeat colonoscopy is recommended for                            surveillance. The colonoscopy date will be                            determined after pathology results from today's                            exam become available for review.                           - See the other procedure note for documentation of                            additional recommendations. Sou Nohr L. Myrtie Neither, MD 01/06/2023 2:36:16 PM This report has been signed electronically.

## 2023-01-06 NOTE — Progress Notes (Signed)
Report given to PACU, vss 

## 2023-01-06 NOTE — Progress Notes (Signed)
Pt's states no medical or surgical changes since previsit or office visit. 

## 2023-01-06 NOTE — Progress Notes (Signed)
Called to room to assist during endoscopic procedure.  Patient ID and intended procedure confirmed with present staff. Received instructions for my participation in the procedure from the performing physician.  

## 2023-01-07 ENCOUNTER — Telehealth: Payer: Self-pay

## 2023-01-07 ENCOUNTER — Telehealth: Payer: Self-pay | Admitting: *Deleted

## 2023-01-07 NOTE — Telephone Encounter (Signed)
Megan Lynn had ECL 01/06/2023 with Dr Myrtie Neither. Recommendation for general surgery referral. She wants to see the same surgeon her husband see's. According to the report she is going to provide [place] with the contact information. I called and left her a message to please call us back with this information.

## 2023-01-07 NOTE — Telephone Encounter (Signed)
  Follow up Call-     01/06/2023    1:49 PM  Call back number  Post procedure Call Back phone  # [phone]  Permission to leave phone message Yes   [facility]

## 2023-01-12 ENCOUNTER — Ambulatory Visit: Payer: Medicare PPO | Admitting: Podiatry

## 2023-01-12 DIAGNOSIS — L97522 Non-pressure chronic ulcer of other part of left foot with fat layer exposed: Secondary | ICD-10-CM | POA: Diagnosis not present

## 2023-01-12 DIAGNOSIS — E0843 Diabetes mellitus due to underlying condition with diabetic autonomic (poly)neuropathy: Secondary | ICD-10-CM

## 2023-01-12 NOTE — Progress Notes (Signed)
   Chief Complaint  Patient presents with   Diabetic Ulcer    Patient came in today for diabetic ulcer left 2nd toe, A1c- 5.4 BG- not taking,     Subjective:  52 y.o. female with PMHx of diabetes mellitus presenting to the office today for follow-up evaluation of ulcers that developed to the plantar aspect of the right forefoot.  Last visit on 09/06/2022 the wounds had healed however she has noticed increased redness and swelling to the distal tip of the left second toenail.  She has been applying gentamicin cream with a Band-Aid.   Past Medical History:  Diagnosis Date   Asthma    Diabetes (HCC)    Eczema    Food allergy    Glaucoma     Past Surgical History:  Procedure Laterality Date   GLAUCOMA SURGERY     TOE SURGERY Left 04/2018   TOE SURGERY Left    TYMPANOSTOMY TUBE PLACEMENT Bilateral 2020   eyes    Allergies  Allergen Reactions   Ciprofloxacin Hives and Shortness Of Breath   Erythromycin Shortness Of Breath and Rash   Shellfish Allergy Shortness Of Breath and Rash   Sulfa Antibiotics Shortness Of Breath, Rash and Anaphylaxis    Objective/Physical Exam General: The patient is alert and oriented x3 in no acute distress.  Dermatology:  Ulcer noted to the distal tip of the left second toe measuring approximately 0.5 x 0.2 time 0.1 cm.  Very stable. Ulcer development also noted to the plantar aspect of the fifth MTP of the right foot.  Measures approximately 1.5 x 1.5 x 0.2 cm.  Granular wound base.  No exposed bone muscle tendon ligament or joint.  Vascular: Palpable pedal pulses bilaterally. No edema or erythema noted. Capillary refill within normal limits.  There is some localized erythema around the distal tip of the left second toe  Neurological: Light touch and protective threshold diminished bilaterally.   Musculoskeletal Exam: High arches noted with plantarflexed metatarsals and hammertoe deformities creating excessive pressure on the metatarsal heads  contributing to the patient's ulcer development  Radiographic exam RT foot 06/22/2022: Normal osseous mineralization.  No degenerative changes or cortical irregularities concerning for osteomyelitis.  Metatarsus adductus noted  Assessment: 1.  Ulcers left second toe secondary to diabetes mellitus; resolved 2. diabetes mellitus w/ peripheral neuropathy.  Last A1c 05/12/2022 5.9 3.  Preulcerative callus lesions bilateral feet -Patient evaluated -Excisional debridement of the hyperkeratotic preulcerative callus tissue was performed using a 312 scalpel without incident or bleeding.  Patient felt significant relief - Medically necessary excisional debridement including subcutaneous tissue was performed to the left second toe ulcer using a tissue nipper.  Excisional debridement of the necrotic nonviable tissue down to healthier bleeding viable tissue was performed with postdebridement regimen same as pre-. -Continue gentamicin cream and a Band-Aid - Diabetic shoes and custom molded diabetic insoles are pending -Return to clinic 2 months  Felecia Shelling, DPM [facility]  Dr. Felecia Shelling, DPM    [address] [place], [place] [address]                Office [phone]  Fax [phone]

## 2023-01-15 ENCOUNTER — Encounter: Payer: Self-pay | Admitting: Gastroenterology

## 2023-01-17 NOTE — Telephone Encounter (Signed)
Patient is scheduled for surgery on 02-21-2023 with Dr Lily Peer with [facility]

## 2023-01-29 HISTORY — PX: CHOLECYSTECTOMY: SHX55

## 2023-02-19 ENCOUNTER — Other Ambulatory Visit: Payer: Self-pay | Admitting: Allergy and Immunology

## 2023-03-04 ENCOUNTER — Other Ambulatory Visit: Payer: Self-pay | Admitting: Allergy and Immunology

## 2023-03-10 ENCOUNTER — Encounter: Payer: Self-pay | Admitting: Allergy and Immunology

## 2023-03-15 ENCOUNTER — Other Ambulatory Visit: Payer: Self-pay

## 2023-03-15 MED ORDER — TACROLIMUS 0.1 % EX OINT: TOPICAL_OINTMENT | CUTANEOUS | 4 refills | Status: DC

## 2023-03-15 MED ORDER — MOMETASONE FUROATE 0.1 % EX CREA: TOPICAL_CREAM | CUTANEOUS | 4 refills | Status: DC

## 2023-03-16 ENCOUNTER — Ambulatory Visit: Payer: Medicare PPO | Admitting: Podiatry

## 2023-03-16 DIAGNOSIS — B351 Tinea unguium: Secondary | ICD-10-CM | POA: Diagnosis not present

## 2023-03-16 DIAGNOSIS — M79674 Pain in right toe(s): Secondary | ICD-10-CM | POA: Diagnosis not present

## 2023-03-16 DIAGNOSIS — M79675 Pain in left toe(s): Secondary | ICD-10-CM

## 2023-03-16 DIAGNOSIS — M7742 Metatarsalgia, left foot: Secondary | ICD-10-CM

## 2023-03-16 DIAGNOSIS — E0843 Diabetes mellitus due to underlying condition with diabetic autonomic (poly)neuropathy: Secondary | ICD-10-CM

## 2023-03-16 DIAGNOSIS — Q667 Congenital pes cavus, unspecified foot: Secondary | ICD-10-CM

## 2023-03-16 DIAGNOSIS — L97522 Non-pressure chronic ulcer of other part of left foot with fat layer exposed: Secondary | ICD-10-CM | POA: Diagnosis not present

## 2023-03-16 DIAGNOSIS — M7741 Metatarsalgia, right foot: Secondary | ICD-10-CM

## 2023-03-16 NOTE — Progress Notes (Signed)
   No chief complaint on file.   SUBJECTIVE Patient with a history of diabetes mellitus presents to office today for routine diabetic footcare.  Patient states that she has developed an ulcer to the left second toe.  Idiopathic onset.  She has neuropathy and cannot feel her feet.  Past Medical History:  Diagnosis Date   Asthma    Diabetes (HCC)    Eczema    Food allergy    Glaucoma     Allergies  Allergen Reactions   Ciprofloxacin Hives and Shortness Of Breath   Erythromycin Shortness Of Breath and Rash   Shellfish Allergy Shortness Of Breath and Rash   Sulfa Antibiotics Shortness Of Breath, Rash and Anaphylaxis     OBJECTIVE General Patient is awake, alert, and oriented x 3 and in no acute distress. Derm Skin is dry and supple bilateral. Negative open lesions or macerations. Remaining integument unremarkable. Nails are tender, long, thickened and dystrophic with subungual debris, consistent with onychomycosis, 1-5 bilateral. No signs of infection noted.  Preulcerative callus lesions also noted to the bilateral forefoot, first and fifth MTP Ulcer noted to the left second toe measuring approximately 0.6 x 0.2 x 0.1 cm.  Granular wound base.  No exposed bone muscle tendon ligament or joint.  No malodor.  It appears very stable Vasc  DP and PT pedal pulses palpable bilaterally. Temperature gradient within normal limits.  Neuro light touch and protective threshold sensation diminished bilaterally.  Musculoskeletal Exam high arches noted bilateral with hammertoe deformities of the lesser digits and excessive pressure on the bilateral forefoot  ASSESSMENT 1. Diabetes Mellitus w/ peripheral neuropathy 2.  Pain due to onychomycosis of toenails bilateral 3.  Ulcer left second toe 4.  High arches bilateral 5.  Preulcerative callus lesions bilateral forefoot secondary to hammertoe deformity  PLAN OF CARE -Patient evaluated today. -Instructed to maintain good pedal hygiene and foot  care. Stressed importance of controlling blood sugar.  -Mechanical debridement of nails 1-5 bilaterally performed using a nail nipper. Filed with dremel without incident.  -Excisional debridement of the hyperkeratotic callus lesions was performed today using 312 scalpel without incident or bleeding -Medically necessary excisional debridement including subcutaneous tissue was performed to the left second toe ulcer using a tissue nipper.  Excisional debridement of the necrotic nonviable tissue down to healthier bleeding viable tissue was performed with postoperative management same as. -Prescription for gentamicin cream apply daily with a Band-Aid -Appointment with diabetic shoe department for custom molded Plastizote insoles and diabetic shoes.  Order placed -Return to clinic 3 months    Felecia Shelling, DPM [facility]  Dr. Felecia Shelling, DPM    [address] [place], [place] [address]                Office [phone]  Fax [phone]

## 2023-03-25 ENCOUNTER — Telehealth: Payer: Self-pay | Admitting: Podiatry

## 2023-03-25 ENCOUNTER — Encounter: Payer: Self-pay | Admitting: Podiatry

## 2023-03-25 NOTE — Telephone Encounter (Signed)
Pt called in with slight fever from infection on foot. Pt was wanting a Rx for antibiotics. PT sending picture Via  [employer]  Please advise

## 2023-03-28 ENCOUNTER — Encounter: Payer: Self-pay | Admitting: Podiatry

## 2023-03-28 ENCOUNTER — Other Ambulatory Visit: Payer: Self-pay | Admitting: Allergy and Immunology

## 2023-03-28 ENCOUNTER — Other Ambulatory Visit: Payer: Self-pay | Admitting: Podiatry

## 2023-03-28 MED ORDER — DOXYCYCLINE HYCLATE 100 MG PO TABS: 100.0000 mg | ORAL_TABLET | Freq: Two times a day (BID) | ORAL | 0 refills | Status: DC

## 2023-03-28 NOTE — Telephone Encounter (Signed)
Can you get her in to see someone ASAP? Thanks!

## 2023-03-31 ENCOUNTER — Ambulatory Visit (INDEPENDENT_AMBULATORY_CARE_PROVIDER_SITE_OTHER): Payer: Medicare PPO

## 2023-03-31 ENCOUNTER — Other Ambulatory Visit: Payer: Self-pay | Admitting: Allergy and Immunology

## 2023-03-31 ENCOUNTER — Ambulatory Visit: Payer: Medicare PPO | Admitting: Podiatry

## 2023-03-31 DIAGNOSIS — L97521 Non-pressure chronic ulcer of other part of left foot limited to breakdown of skin: Secondary | ICD-10-CM

## 2023-03-31 DIAGNOSIS — L03032 Cellulitis of left toe: Secondary | ICD-10-CM

## 2023-03-31 DIAGNOSIS — L97522 Non-pressure chronic ulcer of other part of left foot with fat layer exposed: Secondary | ICD-10-CM | POA: Diagnosis not present

## 2023-03-31 DIAGNOSIS — E0843 Diabetes mellitus due to underlying condition with diabetic autonomic (poly)neuropathy: Secondary | ICD-10-CM

## 2023-03-31 NOTE — Progress Notes (Signed)
Subjective:  Patient ID: Megan Lynn, female    DOB: [birth year],  MRN: [id number]  Chief Complaint  Patient presents with   Wound Check    diabetic with ulcer to 2nd toe left foot that is infected. Patient continues to take antibiotics.     52 y.o. female presents with concern for redness swelling and ulceration to the left second toe at the distal aspect.  Concern for infection.  She was originally placed on doxycycline earlier this week but due to allergies she cannot take it.  She was then switched to Augmentin.  She has been taking Augmentin since Sunday and says that it has been helping a lot with the redness and swelling.  She says that her toe had "blown up "and was much worse 5 to 7 days ago.  Does have a history of diabetes type 2 with neuropathy.  She has been dealing with issues with this toe for several months now having issues on and off with it with redness swelling and worsening of the wound.  Has had prior surgery on the toe.  Past Medical History:  Diagnosis Date   Asthma    Diabetes (HCC)    Eczema    Food allergy    Glaucoma     Allergies  Allergen Reactions   Ciprofloxacin Hives and Shortness Of Breath   Erythromycin Shortness Of Breath and Rash   Shellfish Allergy Shortness Of Breath and Rash   Sulfa Antibiotics Shortness Of Breath, Rash and Anaphylaxis    ROS: Negative except as per HPI above  Objective:  General: AAO x3, NAD  Dermatological: Erythema and edema noted of the distal aspect of the left second toe however decreased from prior per patient.  There is a pinpoint ulceration draining sinus tract that does probe deep on the left second toe.  Measures 0.1 x 0.1 x 0.3 cm.  There is hyperkeratotic tissue and dry peeling skin surrounding the ulceration that was debrided.  See postdebridement pictures below      Vascular:  Dorsalis Pedis artery and Posterior Tibial artery pedal pulses are 2/4 bilateral.  Capillary fill time < 3 sec to all  digits.   Neruologic: Grossly absent via light touch to the second digit and toes on the left foot  Musculoskeletal: No gross boney pedal deformities bilateral. No pain, crepitus, or limitation noted with foot and ankle range of motion bilateral. Muscular strength 5/5 in all groups tested bilateral.  Gait: Unassisted, Nonantalgic.   No images are attached to the encounter.  Radiographs:  Date: 03/31/2023 XR the left foot Weightbearing AP/Lateral/Oblique   Findings: Attention directed left second digit there is no to be postsurgical changes at the distal tuft of the second toe however there is no obvious osseous erosion seen on AP or oblique views of the left foot.  However cannot rule out underlying infection no subcutaneous emphysema noted. Assessment:   1. Skin ulcer of toe of left foot with fat layer exposed (HCC)   2. Cellulitis of toe, left   3. Diabetes mellitus due to underlying condition with diabetic autonomic neuropathy, unspecified whether long term insulin use (HCC)      Plan:  Patient was evaluated and treated and all questions answered.  Ulcer distal aspect of left second toe concern for possible underlying osteomyelitis, improved after oral antibiotics -We discussed the etiology and factors that are a part of the wound healing process.  We also discussed the risk of infection both soft tissue and osteomyelitis from  open ulceration.  Discussed the risk of limb loss if this happens or worsens. -Debridement as below. -Dressed with Betadine, DSD. -Continue home dressing changes daily with gentamicin ointment and dry gauze dressing -Continue off-loading with surgical shoe. -Vascular testing deferred patient has strong palpable pedal pedal pulses -HgbA1c: 5.6 -Last antibiotics: Currently taking Augmentin 875-125 mg twice daily for 10 days, continue -Imaging: x-ray reviewed, shows no signs of erosions, osteolysis, osteomyelitis or emphysema. -Due to concern for recurrent  cellulitis of the left second toe will order ESR and CRP labs if elevated will consider MRI for evaluation as I do suspect the possibility of osteomyelitis of the left second toe.  Procedure: Excisional Debridement of Wound Rationale: Removal of non-viable soft tissue from the wound to promote healing.  Anesthesia: none Post-Debridement Wound Measurements: 0.1 cm x 0.1 cm x 0.3 cm  Type of Debridement: Sharp Excisional Tissue Removed: Non-viable soft tissue Depth of Debridement: subcutaneous tissue. Technique: Sharp excisional debridement to bleeding, viable wound base.  Dressing: Dry, sterile, compression dressing. Disposition: Patient tolerated procedure well.        Return in about 3 weeks (around 04/21/2023) for f/u L 2nd toe ulcer.          Corinna Gab, DPM [facility] / [facility]

## 2023-04-20 ENCOUNTER — Encounter: Payer: Self-pay | Admitting: Allergy and Immunology

## 2023-04-20 ENCOUNTER — Ambulatory Visit: Payer: Medicare PPO | Admitting: Allergy and Immunology

## 2023-04-20 VITALS — BP 118/86 | HR 100 | Resp 14 | Ht 69.4 in | Wt 227.0 lb

## 2023-04-20 DIAGNOSIS — Z5181 Encounter for therapeutic drug level monitoring: Secondary | ICD-10-CM

## 2023-04-20 DIAGNOSIS — J4530 Mild persistent asthma, uncomplicated: Secondary | ICD-10-CM | POA: Diagnosis not present

## 2023-04-20 DIAGNOSIS — J3089 Other allergic rhinitis: Secondary | ICD-10-CM

## 2023-04-20 DIAGNOSIS — L2089 Other atopic dermatitis: Secondary | ICD-10-CM

## 2023-04-20 DIAGNOSIS — T7800XA Anaphylactic reaction due to unspecified food, initial encounter: Secondary | ICD-10-CM | POA: Diagnosis not present

## 2023-04-20 MED ORDER — AIRSUPRA 90-80 MCG/ACT IN AERO: 2.0000 | INHALATION_SPRAY | RESPIRATORY_TRACT | 1 refills | Status: DC | PRN

## 2023-04-20 NOTE — Progress Notes (Unsigned)
[place] [facility] [place] - [place] - [place] - [place]   Follow-up Note  Referring Provider: [name], MD Primary Provider: [name], MD Date of Office Visit: 04/20/2023  Subjective:   Megan Lynn (DOB: 12-09-1970) is a 52 y.o. female who [facility] in re-evaluation of the following:  HPI: Megan Lynn returns to this clinic in evaluation of atopic dermatitis, asthma, allergic rhinitis, shellfish allergy.  I last saw her in this clinic 19 April 2022.  She is no longer using any injectable IL-4/13 biologic agent and has been having very significant problems with her atopic dermatitis requiring her to use topical mometasone and tacrolimus twice a day especially to her hands but also to other areas of her body.  She is interested in restarting a biologic agent.  She still had active atopic dermatitis while using dupilumab and tralokinumab in the past.  Fortunately, her asthma is rarely active and she rarely uses a short acting bronchodilator.  And she has had very little issues with her upper airway while using some nasal steroid.  She apparently had an allergic reaction to doxycycline with a very inflamed dermatitis and pruritus that was administered for what sounds like a toe cellulitis that did require the administration of systemic steroid to get under control this summer.  And she had her gallbladder removed for stones in June 2024.  Allergies as of 04/20/2023       Reactions   Ciprofloxacin Hives, Shortness Of Breath   Doxycycline Shortness Of Breath, Itching   Erythromycin Shortness Of Breath, Rash   Shellfish Allergy Shortness Of Breath, Rash   Sulfa Antibiotics Shortness Of Breath, Rash, Anaphylaxis        Medication List    albuterol 108 (90 Base) MCG/ACT inhaler Commonly known as: ProAir HFA Can inhale two puffs every four to six hours as needed for cough, wheeze, or shortness of breath.   Auvi-Q 0.3  MG/0.3ML Soaj injection Generic drug: EPINEPHrine Use as directed for life-threatening allergic reaction.   cetirizine 10 MG tablet Commonly known as: ZYRTEC Take 10 mg by mouth daily.   fluticasone 50 MCG/ACT nasal spray Commonly known as: FLONASE Place 1 spray into both nostrils 2 (two) times daily.   gentamicin cream 0.1 % Commonly known as: GARAMYCIN APPLY  CREAM TOPICALLY TO AFFECTED AREA TWICE DAILY   hydrOXYzine 25 MG tablet Commonly known as: ATARAX Take 1 tablet by mouth twice daily   metFORMIN 500 MG 24 hr tablet Commonly known as: GLUCOPHAGE-XR Take 1,000 mg by mouth 2 (two) times daily with a meal.   mometasone 0.1 % cream Commonly known as: ELOCON Apply cream 1-2 times per day   Mounjaro 15 MG/0.5ML Pen Generic drug: tirzepatide SMARTSIG:15 Milligram(s) SUB-Q Once a Week   Qvar RediHaler 80 MCG/ACT inhaler Generic drug: beclomethasone Inhale three puffs three times daily during asthma flare-up.  Rinse, gargle, and spit after use.   tacrolimus 0.1 % ointment Commonly known as: PROTOPIC Apply ointment  1-2 times per day   VITAMIN D3 PO Take by mouth daily.    Past Medical History:  Diagnosis Date   Asthma    Diabetes (HCC)    Eczema    Food allergy    Glaucoma     Past Surgical History:  Procedure Laterality Date   CHOLECYSTECTOMY  01/2023   GLAUCOMA SURGERY     TOE SURGERY Left 04/2018   TOE SURGERY Left    TYMPANOSTOMY TUBE PLACEMENT Bilateral  2020   eyes    Review of systems negative except as noted in HPI / PMHx or noted below:  Review of Systems  Constitutional: Negative.   HENT: Negative.    Eyes: Negative.   Respiratory: Negative.    Cardiovascular: Negative.   Gastrointestinal: Negative.   Genitourinary: Negative.   Musculoskeletal: Negative.   Skin: Negative.   Neurological: Negative.   Endo/Heme/Allergies: Negative.   Psychiatric/Behavioral: Negative.       Objective:   Vitals:   04/20/23 1059  BP: 118/86   Pulse: 100  Resp: 14  SpO2: 99%   Height: 5' 9.4" (176.3 cm)  Weight: 227 lb (103 kg)   Physical Exam Constitutional:      Appearance: She is not diaphoretic.  HENT:     Head: Normocephalic.     Right Ear: Tympanic membrane, ear canal and external ear normal.     Left Ear: Tympanic membrane, ear canal and external ear normal.     Nose: Nose normal. No mucosal edema or rhinorrhea.     Mouth/Throat:     Pharynx: Uvula midline. No oropharyngeal exudate.  Eyes:     Conjunctiva/sclera: Conjunctivae normal.  Neck:     Thyroid: No thyromegaly.     Trachea: Trachea normal. No tracheal tenderness or tracheal deviation.  Cardiovascular:     Rate and Rhythm: Normal rate and regular rhythm.     Heart sounds: Normal heart sounds, S1 normal and S2 normal. No murmur heard. Pulmonary:     Effort: No respiratory distress.     Breath sounds: Normal breath sounds. No stridor. No wheezing or rales.  Lymphadenopathy:     Head:     Right side of head: No tonsillar adenopathy.     Left side of head: No tonsillar adenopathy.     Cervical: No cervical adenopathy.  Skin:    Findings: Rash (eczema hands, back, acf) present. No erythema.     Nails: There is no clubbing.  Neurological:     Mental Status: She is alert.     Diagnostics:   Results of blood tests obtained 31 March 2023 identified WBC 10.3, absolute lymphocyte 2200, absolute neutrophils 7100, hemoglobin 14.0, platelet 340, creatinine 0.6 Mg/DL, AST 40J/W, ALT 11B/J,  Results of blood tests obtained 09 February 2022 identified cholesterol 199 Mg/DL, HDL 45 Mg/DL, LDL 478 mg/DL, triglyceride 295 Mg/DL  Assessment and Plan:   1. Asthma, well controlled, mild persistent   2. Other allergic rhinitis   3. Other atopic dermatitis   4. Allergy with anaphylaxis due to food   5. Therapeutic drug monitoring    1.  Obtain blood - QuantiFERON  2.  Start RinVoq 15 mg - 1 tablet 1 time per day  2.  If needed:   A.  Mometasone 0.1% cream +  tacrolimus 0.1% 1-2 times per day  B.  Hydroxyzine 25 mg 2 times per day  E.  Flonase 1-2 sprays each nostril 1 time per day  F.  Auvi-Q 0.3  G. Airsupra - 2 inhalations every 6 hours (replace albuterol)  4. Return to clinic in 1 month or earlier if needed. Blood tests  6. Obtain fall flu vaccine  Megan Lynn still has a very active eczema and she has failed anti-IL-4/13 biologic agents and we will now start her on a oral Jak inhibitor.  She can continue on topical steroids and a calcineurin inhibitor at this point.  She can use a anti-inflammatory rescue medicine should it be required.  I  will see her back in this clinic in 1 month and we will need to repeat her blood test while using Rinvoq   Laurette Schimke, MD Allergy / Immunology [facility]

## 2023-04-20 NOTE — Patient Instructions (Addendum)
  1.  Obtain blood - QuantiFERON  2.  Start RinVoq 15 mg - 1 tablet 1 time per day  2.  If needed:   A.  Mometasone 0.1% cream + tacrolimus 0.1% 1-2 times per day  B.  Hydroxyzine 25 mg 2 times per day  E.  Flonase 1-2 sprays each nostril 1 time per day  F.  Auvi-Q 0.3  G. Airsupra - 2 inhalations every 6 hours (replace albuterol)  4. Return to clinic in 1 month or earlier if needed. Blood tests  6. Obtain fall flu vaccine

## 2023-04-21 ENCOUNTER — Encounter: Payer: Self-pay | Admitting: Allergy and Immunology

## 2023-04-22 ENCOUNTER — Telehealth: Payer: Self-pay

## 2023-04-22 ENCOUNTER — Other Ambulatory Visit (HOSPITAL_COMMUNITY): Payer: Self-pay

## 2023-04-22 ENCOUNTER — Other Ambulatory Visit: Payer: Self-pay | Admitting: Allergy and Immunology

## 2023-04-22 NOTE — Telephone Encounter (Signed)
Pharmacy Patient Advocate Encounter   Received notification from RX Request Messages that prior authorization for Airsupra 90-80MCG/ACT aerosol is required/requested.   Insurance verification completed.   The patient is insured through [employer] .   Per test claim: PA required; PA submitted to [facility] via CoverMyMeds Key/confirmation #/[id number] [facility] Status is pending

## 2023-04-22 NOTE — Telephone Encounter (Signed)
Pa please

## 2023-04-23 LAB — QUANTIFERON-TB GOLD PLUS
QuantiFERON Mitogen Value: >10 [IU]/mL
QuantiFERON Nil Value: 0 [IU]/mL
QuantiFERON TB1 Ag Value: 0 [IU]/mL
QuantiFERON TB2 Ag Value: 0 [IU]/mL
QuantiFERON-TB Gold Plus: NEGATIVE

## 2023-04-24 ENCOUNTER — Other Ambulatory Visit: Payer: Self-pay | Admitting: Allergy and Immunology

## 2023-04-25 ENCOUNTER — Other Ambulatory Visit (HOSPITAL_COMMUNITY): Payer: Self-pay

## 2023-04-25 ENCOUNTER — Ambulatory Visit: Payer: Medicare PPO | Admitting: Podiatry

## 2023-04-25 ENCOUNTER — Other Ambulatory Visit: Payer: Self-pay | Admitting: Allergy and Immunology

## 2023-04-25 DIAGNOSIS — L97522 Non-pressure chronic ulcer of other part of left foot with fat layer exposed: Secondary | ICD-10-CM | POA: Diagnosis not present

## 2023-04-25 DIAGNOSIS — E0843 Diabetes mellitus due to underlying condition with diabetic autonomic (poly)neuropathy: Secondary | ICD-10-CM

## 2023-04-25 MED ORDER — AMOXICILLIN-POT CLAVULANATE 875-125 MG PO TABS: 1.0000 | ORAL_TABLET | Freq: Two times a day (BID) | ORAL | 0 refills | Status: DC

## 2023-04-25 NOTE — Progress Notes (Unsigned)
   No chief complaint on file.   SUBJECTIVE Patient with a history of diabetes mellitus presents to office today for routine diabetic footcare.  Patient states that she has developed an ulcer to the left second toe.  Idiopathic onset.  She has neuropathy and cannot feel her feet.  Past Medical History:  Diagnosis Date   Asthma    Diabetes (HCC)    Eczema    Food allergy    Glaucoma     Allergies  Allergen Reactions   Ciprofloxacin Hives and Shortness Of Breath   Doxycycline Shortness Of Breath and Itching   Erythromycin Shortness Of Breath and Rash   Shellfish Allergy Shortness Of Breath and Rash   Sulfa Antibiotics Shortness Of Breath, Rash and Anaphylaxis     OBJECTIVE General Patient is awake, alert, and oriented x 3 and in no acute distress. Derm Skin is dry and supple bilateral. Negative open lesions or macerations. Remaining integument unremarkable. Nails are tender, long, thickened and dystrophic with subungual debris, consistent with onychomycosis, 1-5 bilateral. No signs of infection noted.  Preulcerative callus lesions also noted to the bilateral forefoot, first and fifth MTP Ulcer noted to the left second toe measuring approximately 0.6 x 0.2 x 0.1 cm.  Granular wound base.  No exposed bone muscle tendon ligament or joint.  No malodor.  It appears very stable Vasc  DP and PT pedal pulses palpable bilaterally. Temperature gradient within normal limits.  Neuro light touch and protective threshold sensation diminished bilaterally.  Musculoskeletal Exam high arches noted bilateral with hammertoe deformities of the lesser digits and excessive pressure on the bilateral forefoot  ASSESSMENT 1. Diabetes Mellitus w/ peripheral neuropathy 2.  Pain due to onychomycosis of toenails bilateral 3.  Ulcer left second toe 4.  High arches bilateral 5.  Preulcerative callus lesions bilateral forefoot secondary to hammertoe deformity  PLAN OF CARE -Patient evaluated  today. -Instructed to maintain good pedal hygiene and foot care. Stressed importance of controlling blood sugar.  -Mechanical debridement of nails 1-5 bilaterally performed using a nail nipper. Filed with dremel without incident.  -Excisional debridement of the hyperkeratotic callus lesions was performed today using 312 scalpel without incident or bleeding -Medically necessary excisional debridement including subcutaneous tissue was performed to the left second toe ulcer using a tissue nipper.  Excisional debridement of the necrotic nonviable tissue down to healthier bleeding viable tissue was performed with postoperative management same as. -Prescription for gentamicin cream apply daily with a Band-Aid -Appointment with diabetic shoe department for custom molded Plastizote insoles and diabetic shoes.  Order placed -Return to clinic 3 months    Felecia Shelling, DPM [facility]  Dr. Felecia Shelling, DPM    [address] [place], [place] [address]                Office [phone]  Fax [phone]

## 2023-04-25 NOTE — Telephone Encounter (Signed)
Pharmacy Patient Advocate Encounter  Received notification from [facility] that Prior Authorization for Airsupra 90-80MCG/ACT aerosol has been APPROVED from 04-22-2023 to 08-29-2023. Ran test claim, Copay is $100.00 Quantity approved is 21.4 grams (2 inhalers) per 30 days. Insurance shows that patient is in the coverage gap/donut hole. This test claim was processed through [facility]- copay amounts may vary at other pharmacies due to pharmacy/plan contracts, or as the patient moves through the different stages of their insurance plan.   PA #/Case ID/Reference #: [facility]

## 2023-05-01 HISTORY — PX: TOE AMPUTATION: SHX809

## 2023-05-07 ENCOUNTER — Encounter: Payer: Self-pay | Admitting: Podiatry

## 2023-05-12 ENCOUNTER — Ambulatory Visit: Payer: Medicare PPO

## 2023-05-12 DIAGNOSIS — E0843 Diabetes mellitus due to underlying condition with diabetic autonomic (poly)neuropathy: Secondary | ICD-10-CM

## 2023-05-12 DIAGNOSIS — Q667 Congenital pes cavus, unspecified foot: Secondary | ICD-10-CM

## 2023-05-12 DIAGNOSIS — M2042 Other hammer toe(s) (acquired), left foot: Secondary | ICD-10-CM

## 2023-05-12 DIAGNOSIS — L97522 Non-pressure chronic ulcer of other part of left foot with fat layer exposed: Secondary | ICD-10-CM

## 2023-05-12 NOTE — Progress Notes (Signed)
Patient presents to the office today for diabetic shoe and insole measuring.  Patient was measured with brannock device to determine size and width for 1 pair of extra depth shoes and foam casted for 3 pair of insoles.   Documentation of medical necessity will be sent to patient's treating diabetic doctor to verify and sign.   Patient's diabetic provider: Arlan Organ and insoles will be ordered at that time and patient will be notified for an appointment for fitting when they arrive.   Shoe size (per patient): 11 Brannock measurement: 11 Patient shoe selection- Shoe choice:   P7200W / [id number] Shoe size ordered: 11WD ABN and financials signed  Scanned with scanner and sent to [employer], CFo, CFm

## 2023-05-23 ENCOUNTER — Other Ambulatory Visit: Payer: Self-pay | Admitting: Podiatry

## 2023-05-25 ENCOUNTER — Other Ambulatory Visit: Payer: Self-pay | Admitting: Podiatry

## 2023-05-25 MED ORDER — AMOXICILLIN-POT CLAVULANATE 875-125 MG PO TABS: 1.0000 | ORAL_TABLET | Freq: Two times a day (BID) | ORAL | 0 refills | Status: DC

## 2023-05-25 MED ORDER — FLUCONAZOLE 150 MG PO TABS: 150.0000 mg | ORAL_TABLET | Freq: Once | ORAL | 0 refills | Status: AC

## 2023-05-25 NOTE — Progress Notes (Signed)
As needed toe infection.  Has follow-up appointment with me 05/30/2023.  Felecia Shelling, DPM [facility]  Dr. Felecia Shelling, DPM    [address] [place], [place] [address]                Office [phone]  Fax [phone]

## 2023-05-30 ENCOUNTER — Ambulatory Visit: Payer: Medicare PPO | Admitting: Podiatry

## 2023-05-30 ENCOUNTER — Telehealth: Payer: Self-pay | Admitting: Podiatry

## 2023-05-30 ENCOUNTER — Encounter: Payer: Self-pay | Admitting: Podiatry

## 2023-05-30 ENCOUNTER — Ambulatory Visit (INDEPENDENT_AMBULATORY_CARE_PROVIDER_SITE_OTHER): Payer: Medicare PPO

## 2023-05-30 DIAGNOSIS — L03032 Cellulitis of left toe: Secondary | ICD-10-CM

## 2023-05-30 DIAGNOSIS — M869 Osteomyelitis, unspecified: Secondary | ICD-10-CM | POA: Diagnosis not present

## 2023-05-30 NOTE — Progress Notes (Signed)
   Chief Complaint  Patient presents with   Foot Ulcer    Left 2nd toe, with cellulitis, currently taking augmentin    SUBJECTIVE Patient with a history of diabetes mellitus presents to office today for follow-up evaluation of an ulcer to the distal aspect of the left second toe.  She continues to have drainage coming from the toe.  She presents for further treatment evaluation  Past Medical History:  Diagnosis Date   Asthma    Diabetes (HCC)    Eczema    Food allergy    Glaucoma     Allergies  Allergen Reactions   Ciprofloxacin Hives and Shortness Of Breath   Doxycycline Shortness Of Breath and Itching   Erythromycin Shortness Of Breath and Rash   Shellfish Allergy Shortness Of Breath and Rash   Sulfa Antibiotics Shortness Of Breath, Rash and Anaphylaxis     OBJECTIVE General Patient is awake, alert, and oriented x 3 and in no acute distress. Derm Skin is dry and supple bilateral. Negative open lesions or macerations. Remaining integument unremarkable. Nails are tender, long, thickened and dystrophic with subungual debris, consistent with onychomycosis, 1-5 bilateral. No signs of infection noted.  Preulcerative callus lesions also noted to the bilateral forefoot, first and fifth MTP Overall improvement.  Ulcer noted to the left second toe measuring approximately 0.3 x 0.1 x 0.1 cm.  Granular wound base.  No exposed bone muscle tendon ligament or joint.  No malodor.  It appears very stable Vasc  DP and PT pedal pulses palpable bilaterally. Temperature gradient within normal limits.  Neuro light touch and protective threshold sensation diminished bilaterally.  Musculoskeletal Exam high arches noted bilateral with hammertoe deformities of the lesser digits and excessive pressure on the bilateral forefoot Radiographic exam LT foot 05/30/2023 new findings of erosive changes noted to the middle phalanx of the left second toe consistent with osteomyelitis.  No gas within the tissue.  No  other acute findings  ASSESSMENT 1. Diabetes Mellitus w/ peripheral neuropathy 2.  Ulcer left second toe with underlying osteomyelitis 3.  High arches bilateral 4.  Preulcerative callus lesions bilateral forefoot secondary to hammertoe deformity  PLAN OF CARE -Patient evaluated today. - X-rays were reviewed today unfortunately consistent with osteomyelitis of the middle phalanx of the toe.  Discussed the need for amputation of the toe and the patient is amenable to this plan and agrees.  Risk benefits advantages and disadvantages were explained in detail to the patient.  No guarantees were expressed or implied -Authorization for surgery was initiated today.  Surgery will consist of left second toe amputation -Continue Augmentin 875/125 mg - Continue gentamicin cream apply daily with a Band-Aid - Diabetic shoes and custom molded Plastizote insoles pending.  She was molded on 05/12/2023 here in our office -Cultures taken and sent to pathology for culture and sensitivity -Return to clinic 1 week postop    Felecia Shelling, DPM [facility]  Dr. Felecia Shelling, DPM    [address] [place], [place] [address]                Office [phone]  Fax [phone]

## 2023-05-30 NOTE — Telephone Encounter (Signed)
DOS- 06/02/2023  AMPUTATION TOE MPJ 2ND [id number]  [employer] EFFECTIVE DATE- 08/30/2021  OOP- $4000.00 WITH REMAINING $2,915.24  COINSURANCE- 0%  PER THE [place] WEBSITE PORTAL, PRIOR AUTH IS NOT REQUIRED FOR CPT CODE 46962.   AUTH Tracking [id number]

## 2023-06-02 ENCOUNTER — Other Ambulatory Visit: Payer: Self-pay | Admitting: Podiatry

## 2023-06-02 DIAGNOSIS — M86172 Other acute osteomyelitis, left ankle and foot: Secondary | ICD-10-CM | POA: Diagnosis not present

## 2023-06-02 LAB — WOUND CULTURE
MICRO NUMBER:: 15530601
SPECIMEN QUALITY:: ADEQUATE

## 2023-06-02 MED ORDER — AMOXICILLIN-POT CLAVULANATE 875-125 MG PO TABS: 1.0000 | ORAL_TABLET | Freq: Two times a day (BID) | ORAL | 0 refills | Status: DC

## 2023-06-02 MED ORDER — OXYCODONE-ACETAMINOPHEN 5-325 MG PO TABS: 1.0000 | ORAL_TABLET | ORAL | 0 refills | Status: DC | PRN

## 2023-06-02 NOTE — Progress Notes (Signed)
PRN postop 

## 2023-06-08 ENCOUNTER — Ambulatory Visit (INDEPENDENT_AMBULATORY_CARE_PROVIDER_SITE_OTHER): Payer: Medicare PPO | Admitting: Podiatry

## 2023-06-08 ENCOUNTER — Ambulatory Visit (INDEPENDENT_AMBULATORY_CARE_PROVIDER_SITE_OTHER): Payer: Medicare PPO

## 2023-06-08 DIAGNOSIS — Z9889 Other specified postprocedural states: Secondary | ICD-10-CM

## 2023-06-08 DIAGNOSIS — M869 Osteomyelitis, unspecified: Secondary | ICD-10-CM

## 2023-06-08 NOTE — Progress Notes (Signed)
Subjective:  Patient ID: Megan Lynn, female    DOB: [birth year],  MRN: [id number]  No chief complaint on file.   DOS: 06/02/23 Procedure: Left second toe amputation  52 y.o. female returns for POV#1. Relates doing well and managing pain  Review of Systems: Negative except as noted in the HPI. Denies N/V/F/Ch.  Past Medical History:  Diagnosis Date   Asthma    Diabetes (HCC)    Eczema    Food allergy    Glaucoma     Current Outpatient Medications:    AIRSUPRA 90-80 MCG/ACT AERO, INHALE 2 PUFFS INTO LUNGS EVERY 6 HOURS AS NEEDED FOR COUGH , WHEEZING AND FOR SHORTNESS OF BREATH .  RINSE  GARGLE  AND  SPIT  AFTER  USE, Disp: 11 g, Rfl: 1   albuterol (PROAIR HFA) 108 (90 Base) MCG/ACT inhaler, Can inhale two puffs every four to six hours as needed for cough, wheeze, or shortness of breath., Disp: 1 each, Rfl: 0   amoxicillin-clavulanate (AUGMENTIN) 875-125 MG tablet, Take 1 tablet by mouth 2 (two) times daily., Disp: 20 tablet, Rfl: 0   AUVI-Q 0.3 MG/0.3ML SOAJ injection, Use as directed for life-threatening allergic reaction., Disp: 4 Device, Rfl: 1   beclomethasone (QVAR REDIHALER) 80 MCG/ACT inhaler, Inhale three puffs three times daily during asthma flare-up.  Rinse, gargle, and spit after use., Disp: 1 each, Rfl: 0   cetirizine (ZYRTEC) 10 MG tablet, Take 10 mg by mouth daily., Disp: , Rfl:    Cholecalciferol (VITAMIN D3 PO), Take by mouth daily., Disp: , Rfl:    fluticasone (FLONASE) 50 MCG/ACT nasal spray, Place 1 spray into both nostrils 2 (two) times daily., Disp: 16 g, Rfl: 5   gentamicin cream (GARAMYCIN) 0.1 %, APPLY  CREAM TOPICALLY TO AFFECTED AREA TWICE DAILY, Disp: 30 g, Rfl: 0   hydrOXYzine (ATARAX) 25 MG tablet, Take 1 tablet by mouth twice daily, Disp: 60 tablet, Rfl: 5   metFORMIN (GLUCOPHAGE-XR) 500 MG 24 hr tablet, Take 1,000 mg by mouth 2 (two) times daily with a meal., Disp: , Rfl:    mometasone (ELOCON) 0.1 % cream, Apply cream 1-2 times per day, Disp:  45 g, Rfl: 4   MOUNJARO 15 MG/0.5ML Pen, SMARTSIG:15 Milligram(s) SUB-Q Once a Week, Disp: , Rfl:    oxyCODONE-acetaminophen (PERCOCET) 5-325 MG tablet, Take 1 tablet by mouth every 4 (four) hours as needed for severe pain., Disp: 20 tablet, Rfl: 0   tacrolimus (PROTOPIC) 0.1 % ointment, Apply ointment  1-2 times per day, Disp: 100 g, Rfl: 4  Social History   Tobacco Use  Smoking Status Never  Smokeless Tobacco Never    Allergies  Allergen Reactions   Ciprofloxacin Hives and Shortness Of Breath   Doxycycline Shortness Of Breath and Itching   Erythromycin Shortness Of Breath and Rash   Shellfish Allergy Shortness Of Breath and Rash   Sulfa Antibiotics Shortness Of Breath, Rash and Anaphylaxis   Objective:  There were no vitals filed for this visit. There is no height or weight on file to calculate BMI. Constitutional Well developed. Well nourished.  Vascular Foot warm and well perfused. Capillary refill normal to all digits.   Neurologic Normal speech. Oriented to person, place, and time. Epicritic sensation to light touch grossly present bilaterally.  Dermatologic Skin healing well without signs of infection. Skin edges well coapted without signs of infection.  Orthopedic: Tenderness to palpation noted about the surgical site.   Radiographs: Interval removal of left second digit  Assessment:  1. Post-operative state    Plan:  Patient was evaluated and treated and all questions answered.  S/p foot surgery left -Progressing as expected post-operatively. -WB Status: WBAT in surgical shoe -Sutures: intact. -Medications: n/a -Foot redressed.  Return in 2 weeks for post-op with Dr. Logan Bores.   No follow-ups on file.

## 2023-06-15 ENCOUNTER — Ambulatory Visit (INDEPENDENT_AMBULATORY_CARE_PROVIDER_SITE_OTHER): Payer: Medicare PPO | Admitting: Podiatry

## 2023-06-15 ENCOUNTER — Encounter: Payer: Self-pay | Admitting: Podiatry

## 2023-06-15 DIAGNOSIS — S98132A Complete traumatic amputation of one left lesser toe, initial encounter: Secondary | ICD-10-CM | POA: Diagnosis not present

## 2023-06-20 ENCOUNTER — Ambulatory Visit: Payer: Medicare PPO | Admitting: Podiatry

## 2023-06-21 ENCOUNTER — Ambulatory Visit: Payer: Medicare PPO | Admitting: Allergy and Immunology

## 2023-06-28 NOTE — Progress Notes (Signed)
   Chief Complaint  Patient presents with   Routine Post Op   Gout    PATIENT STATES THAT EVERYTHING IS FINE AND JUST WANT TO KNOW WHEN SHE CAN DOING THINGS     Subjective:  Patient presents today status post amputation of the left second toe.  DOS: 06/02/2023.  Patient doing well.  Dressings are clean dry and intact.  WBAT surgical shoe.  No new complaints  Past Medical History:  Diagnosis Date   Asthma    Diabetes (HCC)    Eczema    Food allergy    Glaucoma     Past Surgical History:  Procedure Laterality Date   CHOLECYSTECTOMY  01/2023   GLAUCOMA SURGERY     TOE SURGERY Left 04/2018   TOE SURGERY Left    TYMPANOSTOMY TUBE PLACEMENT Bilateral 2020   eyes    Allergies  Allergen Reactions   Ciprofloxacin Hives and Shortness Of Breath   Doxycycline Shortness Of Breath and Itching   Erythromycin Shortness Of Breath and Rash   Shellfish Allergy Shortness Of Breath and Rash   Sulfa Antibiotics Shortness Of Breath, Rash and Anaphylaxis    Objective/Physical Exam Neurovascular status intact.  Incision well coapted with sutures intact. No sign of infectious process noted. No dehiscence. No active bleeding noted.    Radiographic Exam 06/08/2023:  Interval amputation of the left second toe at the level of the MTP.  No gas within the tissues.  Remaining osseous structures preserved.  Impression: Amputation left second toe at the level of the MTP  Assessment: 1. s/p amputation left second toe. DOS: 06/02/2023   Plan of Care:  -Patient was evaluated. X-rays reviewed that were taken 06/08/2023 - Dressings changed.  Patient may begin washing and showering and getting the foot wet.  Recommend antibiotic cream and a Band-Aid -Continue WBAT surgical shoe -Return to clinic 1 week suture removal   Felecia Shelling, DPM [facility]  Dr. Felecia Shelling, DPM    [address] [place], [place] [address]                Office [phone]  Fax [phone]

## 2023-06-29 ENCOUNTER — Ambulatory Visit (INDEPENDENT_AMBULATORY_CARE_PROVIDER_SITE_OTHER): Payer: Medicare PPO | Admitting: Podiatry

## 2023-06-29 DIAGNOSIS — S98132A Complete traumatic amputation of one left lesser toe, initial encounter: Secondary | ICD-10-CM

## 2023-06-29 NOTE — Progress Notes (Signed)
   Chief Complaint  Patient presents with   Routine Post Op    DIABETIC, NO PAIN, SWELLING OR DRAINAGE, DENIES N/V/F/C/SOB    Subjective:  Patient presents today status post amputation of the left second toe.  DOS: 06/02/2023.  Patient continues to do very well.  No pain and the redness and swelling has resolved.  No new complaints  Past Medical History:  Diagnosis Date   Asthma    Diabetes (HCC)    Eczema    Food allergy    Glaucoma     Past Surgical History:  Procedure Laterality Date   CHOLECYSTECTOMY  01/2023   GLAUCOMA SURGERY     TOE SURGERY Left 04/2018   TOE SURGERY Left    TYMPANOSTOMY TUBE PLACEMENT Bilateral 2020   eyes    Allergies  Allergen Reactions   Ciprofloxacin Hives and Shortness Of Breath   Doxycycline Shortness Of Breath and Itching   Erythromycin Shortness Of Breath and Rash   Shellfish Allergy Shortness Of Breath and Rash   Sulfa Antibiotics Shortness Of Breath, Rash and Anaphylaxis    Objective/Physical Exam Neurovascular status intact.  Incision well coapted with sutures intact. No sign of infectious process noted. No dehiscence. No active bleeding noted.    Radiographic Exam 06/08/2023:  Interval amputation of the left second toe at the level of the MTP.  No gas within the tissues.  Remaining osseous structures preserved.  Impression: Amputation left second toe at the level of the MTP  Assessment: 1. s/p amputation left second toe. DOS: 06/02/2023   Plan of Care:  -Patient was evaluated.  -Sutures removed -Patient may discontinue the postop shoe.  Recommend good supportive tennis shoes and sneakers -Return to clinic 3 months routine footcare  Felecia Shelling, DPM [facility]  Dr. Felecia Shelling, DPM    [address] [place], [place] [address]                Office [phone]  Fax [phone]

## 2023-07-07 ENCOUNTER — Encounter: Payer: Self-pay | Admitting: Allergy and Immunology

## 2023-07-07 ENCOUNTER — Ambulatory Visit: Payer: Medicare PPO | Admitting: Allergy and Immunology

## 2023-07-07 VITALS — BP 116/92 | HR 88 | Resp 14

## 2023-07-07 DIAGNOSIS — L2089 Other atopic dermatitis: Secondary | ICD-10-CM

## 2023-07-07 DIAGNOSIS — J3089 Other allergic rhinitis: Secondary | ICD-10-CM

## 2023-07-07 DIAGNOSIS — T7800XD Anaphylactic reaction due to unspecified food, subsequent encounter: Secondary | ICD-10-CM | POA: Diagnosis not present

## 2023-07-07 DIAGNOSIS — J4530 Mild persistent asthma, uncomplicated: Secondary | ICD-10-CM | POA: Diagnosis not present

## 2023-07-07 DIAGNOSIS — T7800XA Anaphylactic reaction due to unspecified food, initial encounter: Secondary | ICD-10-CM

## 2023-07-07 MED ORDER — HYDROXYZINE HCL 25 MG PO TABS: ORAL_TABLET | ORAL | 1 refills | Status: DC

## 2023-07-07 NOTE — Progress Notes (Signed)
[place] [facility] [place] - [place] - [place] - [place]   Follow-up Note  Referring Provider: [name], MD Primary Provider: [name], MD Date of Office Visit: 07/07/2023  Subjective:   Megan Lynn (DOB: 11/05/1970) is a 52 y.o. female who [facility] 07[facility]23 in re-evaluation of the following:  HPI: Megan Lynn returns to this clinic in evaluation of severe atopic dermatitis, asthma, allergic rhinitis, and history of shellfish allergy.  I last saw her in this clinic 20 April 2023.  We started her on oral Jak inhibitor during her last visit and it was a miracle for her scan.  Unfortunately, she apparently had a flareup of a chronic osteomyelitis of her toe requiring amputation and she discontinued her Rinvoq and then she had a very significant flare of her skin and her respiratory tract issues for which she restarted her Rinvoq at around 15 mg every third day or so which has certainly helped her condition involving her skin and respiratory tract once again.  At this point she does not require any short acting bronchodilator.  At this point she still continues to use mometasone intermittently.  She does not consume shellfish.  Allergies as of 07/07/2023       Reactions   Ciprofloxacin Hives, Shortness Of Breath   Doxycycline Shortness Of Breath, Itching   Erythromycin Shortness Of Breath, Rash   Shellfish Allergy Shortness Of Breath, Rash   Sulfa Antibiotics Shortness Of Breath, Rash, Anaphylaxis        Medication List    Airsupra 90-80 MCG/ACT Aero Generic drug: Albuterol-Budesonide INHALE 2 PUFFS INTO LUNGS EVERY 6 HOURS AS NEEDED FOR COUGH , WHEEZING AND FOR SHORTNESS OF BREATH .  RINSE  GARGLE  AND  SPIT  AFTER  USE   albuterol 108 (90 Base) MCG/ACT inhaler Commonly known as: ProAir HFA Can inhale two puffs every four to six hours as needed for cough, wheeze, or shortness of breath.   Auvi-Q 0.3 MG/0.3ML Soaj  injection Generic drug: EPINEPHrine Use as directed for life-threatening allergic reaction.   cetirizine 10 MG tablet Commonly known as: ZYRTEC Take 10 mg by mouth daily.   cyclobenzaprine 10 MG tablet Commonly known as: FLEXERIL Take 10 mg by mouth 3 (three) times daily as needed.   fluticasone 50 MCG/ACT nasal spray Commonly known as: FLONASE Place 1 spray into both nostrils 2 (two) times daily.   gentamicin cream 0.1 % Commonly known as: GARAMYCIN APPLY  CREAM TOPICALLY TO AFFECTED AREA TWICE DAILY   hydrOXYzine 25 MG tablet Commonly known as: ATARAX Take 1 tablet by mouth twice daily   metFORMIN 500 MG 24 hr tablet Commonly known as: GLUCOPHAGE-XR Take 1,000 mg by mouth 2 (two) times daily with a meal.   mometasone 0.1 % cream Commonly known as: ELOCON Apply cream 1-2 times per day   Mounjaro 15 MG/0.5ML Pen Generic drug: tirzepatide SMARTSIG:15 Milligram(s) SUB-Q Once a Week   Qvar RediHaler 80 MCG/ACT inhaler Generic drug: beclomethasone Inhale three puffs three times daily during asthma flare-up.  Rinse, gargle, and spit after use.   tacrolimus 0.1 % ointment Commonly known as: PROTOPIC Apply ointment  1-2 times per day   VITAMIN D3 PO Take by mouth daily.    Past Medical History:  Diagnosis Date   Asthma    Diabetes (HCC)    Eczema    Food allergy    Glaucoma     Past Surgical History:  Procedure Laterality Date  CHOLECYSTECTOMY  01/2023   GLAUCOMA SURGERY     TOE AMPUTATION Left 05/2023   TOE SURGERY Left 04/2018   TOE SURGERY Left    TYMPANOSTOMY TUBE PLACEMENT Bilateral 2020   eyes    Review of systems negative except as noted in HPI / PMHx or noted below:  Review of Systems  Constitutional: Negative.   HENT: Negative.    Eyes: Negative.   Respiratory: Negative.    Cardiovascular: Negative.   Gastrointestinal: Negative.   Genitourinary: Negative.   Musculoskeletal: Negative.   Skin: Negative.   Neurological: Negative.    Endo/Heme/Allergies: Negative.   Psychiatric/Behavioral: Negative.       Objective:   Vitals:   07/07/23 1118  BP: (!) 116/92  Pulse: 88  Resp: 14  SpO2: 99%          Physical Exam Constitutional:      Appearance: She is not diaphoretic.  HENT:     Head: Normocephalic.     Right Ear: Tympanic membrane, ear canal and external ear normal.     Left Ear: Tympanic membrane, ear canal and external ear normal.     Nose: Nose normal. No mucosal edema or rhinorrhea.     Mouth/Throat:     Pharynx: Uvula midline. No oropharyngeal exudate.  Eyes:     Conjunctiva/sclera: Conjunctivae normal.  Neck:     Thyroid: No thyromegaly.     Trachea: Trachea normal. No tracheal tenderness or tracheal deviation.  Cardiovascular:     Rate and Rhythm: Normal rate and regular rhythm.     Heart sounds: Normal heart sounds, S1 normal and S2 normal. No murmur heard. Pulmonary:     Effort: No respiratory distress.     Breath sounds: Normal breath sounds. No stridor. No wheezing or rales.  Lymphadenopathy:     Head:     Right side of head: No tonsillar adenopathy.     Left side of head: No tonsillar adenopathy.     Cervical: No cervical adenopathy.  Skin:    Findings: No erythema or rash.     Nails: There is no clubbing.  Neurological:     Mental Status: She is alert.     Diagnostics: Spirometry was performed and demonstrated an FEV1 of 3.69 at 93 % of predicted.  Assessment and Plan:   1. Asthma, well controlled, mild persistent   2. Other allergic rhinitis   3. Other atopic dermatitis   4. Allergy with anaphylaxis due to food     1.  Restart RinVoq 15 mg - 1 tablet 1 time per day (1/2 tablet 1 time per day)  2.  If needed:   A.  Mometasone 0.1% cream + tacrolimus 0.1% 1-2 times per day  B.  Hydroxyzine 25 mg 2 times per day  E.  Flonase 1-2 sprays each nostril 1 time per day  F.  Auvi-Q 0.3  G. Airsupra - 2 inhalations every 6 hours  3. Return to clinic in 1 month.   As  expected Joniece had a rather dramatic response to the use of an oral Jak inhibitor regarding her multiorgan atopic disease including severe atopic dermatitis but she very well could have had release of immune control with her chronic osteomyelitis which fortunately has been taking care of with the surgery.  We are going to try her on 7.5 mg/day of her oral Jak inhibitor and we will see what happens utilizing this modified dose.  I will see her back in this clinic in 1  month to assess her response and arrange for monitoring blood test.  Laurette Schimke, MD Allergy / Immunology [facility]

## 2023-07-07 NOTE — Patient Instructions (Signed)
  1.  Restart RinVoq 15 mg - 1 tablet 1 time per day (1/2 tablet 1 time per day)  2.  If needed:   A.  Mometasone 0.1% cream + tacrolimus 0.1% 1-2 times per day  B.  Hydroxyzine 25 mg 2 times per day  E.  Flonase 1-2 sprays each nostril 1 time per day  F.  Auvi-Q 0.3  G. Airsupra - 2 inhalations every 6 hours  3. Return to clinic in 1 month.

## 2023-07-12 ENCOUNTER — Encounter: Payer: Self-pay | Admitting: Allergy and Immunology

## 2023-07-17 ENCOUNTER — Other Ambulatory Visit: Payer: Self-pay | Admitting: Podiatry

## 2023-07-20 ENCOUNTER — Telehealth: Payer: Self-pay

## 2023-07-20 ENCOUNTER — Other Ambulatory Visit: Payer: Self-pay | Admitting: Podiatry

## 2023-07-20 MED ORDER — GENTAMICIN SULFATE 0.1 % EX CREA: TOPICAL_CREAM | CUTANEOUS | 3 refills | Status: DC

## 2023-07-20 NOTE — Telephone Encounter (Signed)
Patient called and left a message. She tried to refill her gentamicin cream, but we denied it. She is asking  to please refill it - she keeps it on hand for when she develops an issue Please advise -thanks

## 2023-07-20 NOTE — Telephone Encounter (Signed)
Refill sent with 3 addt'l refills. Thanks, Dr. Logan Bores

## 2023-07-26 ENCOUNTER — Telehealth: Payer: Self-pay

## 2023-07-26 ENCOUNTER — Other Ambulatory Visit: Payer: Self-pay | Admitting: Podiatry

## 2023-07-26 MED ORDER — AMOXICILLIN-POT CLAVULANATE 875-125 MG PO TABS: 1.0000 | ORAL_TABLET | Freq: Two times a day (BID) | ORAL | 0 refills | Status: DC

## 2023-07-26 NOTE — Telephone Encounter (Signed)
Patient called and left a message - she tried to refill her augmentin? Amoxicillin? But it was denied. She has scheduled an appointment with you on 12/4 - she is asking for enough PO antibiotics to cover her until that appointment. Please advise -Thanks

## 2023-07-26 NOTE — Telephone Encounter (Signed)
Antibiotics sent. - Dr. Logan Bores

## 2023-08-02 ENCOUNTER — Ambulatory Visit: Payer: Medicare PPO | Admitting: Allergy and Immunology

## 2023-08-03 ENCOUNTER — Ambulatory Visit: Payer: Medicare PPO | Admitting: Podiatry

## 2023-08-17 ENCOUNTER — Encounter: Payer: Self-pay | Admitting: Allergy and Immunology

## 2023-08-17 ENCOUNTER — Encounter: Payer: Self-pay | Admitting: Podiatry

## 2023-08-17 ENCOUNTER — Ambulatory Visit: Payer: Medicare PPO | Admitting: Podiatry

## 2023-08-17 DIAGNOSIS — L97512 Non-pressure chronic ulcer of other part of right foot with fat layer exposed: Secondary | ICD-10-CM

## 2023-08-17 MED ORDER — AMOXICILLIN-POT CLAVULANATE 875-125 MG PO TABS: 1.0000 | ORAL_TABLET | Freq: Two times a day (BID) | ORAL | 0 refills | Status: DC

## 2023-08-17 NOTE — Progress Notes (Signed)
Chief Complaint  Patient presents with   Foot Ulcer    RM#7 Right foot ulcer follow up no pain.    Subjective:  52 y.o. female with PMHx of diabetes mellitus presenting today for evaluation of new onset of an ulcer to the plantar aspect of the fifth MTP of the right foot.  Patient states that she did a significant amount of [place] [facility] [place] and she developed an ulcer to the plantar aspect of the right forefoot.  She is currently on Augmentin with about 2 days left.  Patient requesting a refill.  She has a very recent history of left second toe amputation   Past Medical History:  Diagnosis Date   Asthma    Diabetes (HCC)    Eczema    Food allergy    Glaucoma     Past Surgical History:  Procedure Laterality Date   CHOLECYSTECTOMY  01/2023   GLAUCOMA SURGERY     TOE AMPUTATION Left 05/2023   TOE SURGERY Left 04/2018   TOE SURGERY Left    TYMPANOSTOMY TUBE PLACEMENT Bilateral 2020   eyes    Allergies  Allergen Reactions   Ciprofloxacin Hives and Shortness Of Breath   Doxycycline Shortness Of Breath and Itching   Erythromycin Shortness Of Breath and Rash   Shellfish Allergy Shortness Of Breath and Rash   Sulfa Antibiotics Shortness Of Breath, Rash and Anaphylaxis    RT foot 08/17/2023   Objective/Physical Exam General: The patient is alert and oriented x3 in no acute distress.  Dermatology:  Wound #1 noted to the plantar aspect of the fifth MTP right foot measuring approximately 1.5 x 1.5 x 0.2 cm (LxWxD).  Periwound is callused  To the noted ulceration(s), there is no eschar. There is a moderate amount of slough, fibrin, and necrotic tissue noted. Granulation tissue and wound base is red. There is a minimal amount of serosanguineous drainage noted. There is no exposed bone muscle-tendon ligament or joint. There is no malodor. Periwound integrity is intact. Skin is warm, dry and supple bilateral lower extremities.  Vascular: Palpable pedal pulses  bilaterally. No edema or erythema noted. Capillary refill within normal limits.  Neurological: Light touch and protective threshold absent bilaterally.   Musculoskeletal Exam: Prior amputation to the left second toe.  Plantarflexed metatarsals also noted with significant pressure to the first and fifth MTP bilateral  Assessment: 1.  Ulcer plantar aspect of the fifth MTP right foot secondary to diabetes mellitus 2. diabetes mellitus w/ peripheral neuropathy   Plan of Care:  -Patient evaluated -Stressed the importance of offloading pressure from the forefoot.  Patient has very high arches and very plantarflexed metatarsals creating excessive pressure especially to the first and fifth MTP bilateral.  Right greater than the left. -Prefabricated power step insoles were dispensed to the patient today and offloading felt metatarsal pads were applied to the right foot to offload pressure from the forefoot -Prescription for Augmentin 875/125 mg -Continue topical antibiotic ointment -Return to clinic 3 weeks  Felecia Shelling, DPM [facility]  Dr. Felecia Shelling, DPM    [address] [place], [place] [address]                Office [phone]  Fax [phone]

## 2023-08-18 ENCOUNTER — Telehealth: Payer: Self-pay | Admitting: *Deleted

## 2023-08-18 ENCOUNTER — Other Ambulatory Visit (HOSPITAL_COMMUNITY): Payer: Self-pay

## 2023-08-18 MED ORDER — RINVOQ 15 MG PO TB24
15.0000 mg | ORAL_TABLET | Freq: Every day | ORAL | 11 refills | Status: DC
Start: 2023-08-18 — End: 2023-10-17
  Filled 2023-09-08 – 2023-09-15 (×3): qty 30, 30d supply, fill #0

## 2023-08-18 NOTE — Telephone Encounter (Signed)
Patient requesting Rx for Rinvoq. Per Dr Lucie Leather patient needs to come in for labs to get refill. Appt made for 12/20 in [place]

## 2023-08-19 ENCOUNTER — Other Ambulatory Visit: Payer: Self-pay

## 2023-08-19 ENCOUNTER — Ambulatory Visit: Payer: Medicare PPO | Admitting: Internal Medicine

## 2023-08-19 ENCOUNTER — Encounter: Payer: Self-pay | Admitting: Internal Medicine

## 2023-08-19 VITALS — BP 130/84 | HR 99 | Temp 97.8°F | Resp 17 | Wt 212.6 lb

## 2023-08-19 DIAGNOSIS — J3089 Other allergic rhinitis: Secondary | ICD-10-CM

## 2023-08-19 DIAGNOSIS — L2089 Other atopic dermatitis: Secondary | ICD-10-CM | POA: Diagnosis not present

## 2023-08-19 DIAGNOSIS — T7800XD Anaphylactic reaction due to unspecified food, subsequent encounter: Secondary | ICD-10-CM

## 2023-08-19 DIAGNOSIS — J4530 Mild persistent asthma, uncomplicated: Secondary | ICD-10-CM | POA: Diagnosis not present

## 2023-08-19 DIAGNOSIS — Z5181 Encounter for therapeutic drug level monitoring: Secondary | ICD-10-CM | POA: Diagnosis not present

## 2023-08-19 DIAGNOSIS — T7800XA Anaphylactic reaction due to unspecified food, initial encounter: Secondary | ICD-10-CM

## 2023-08-19 NOTE — Addendum Note (Signed)
Addended by: Floydene Flock C on: 08/19/2023 04:22 PM   Modules accepted: Orders

## 2023-08-19 NOTE — Patient Instructions (Addendum)
  1.  Continue RinVoq 15 mg - 1 tablet 1 time per day (1/2 tablet 1 time per day if needed)  Labs ordered today to submit for approval: Lipid, CBC, quantiferon gold, hepatitis panel   2.  If needed:   A.  Mometasone 0.1% cream + tacrolimus 0.1% 1-2 times per day  B. Opzelura 1-2 times daily for eyelid flares   -sample given   B.  Hydroxyzine 25 mg 2 times per day  E.  Flonase 1-2 sprays each nostril 1 time per day  F.  Auvi-Q 0.3  G. Airsupra - 2 inhalations every 6 hours  3. Return to clinic in  3-6 months

## 2023-08-19 NOTE — Addendum Note (Signed)
Addended by: Ralph Leyden on: 08/19/2023 04:25 PM   Modules accepted: Orders

## 2023-08-19 NOTE — Progress Notes (Addendum)
FOLLOW UP Date of Service/Encounter:  08/19/23  Subjective:  Megan Lynn (DOB: November 11, 1970) is a 52 y.o. female who [facility] on 08/19/2023 in re-evaluation of the following: severe atopic dermatitis, asthma, allergic rhinitis, and history of shellfish allergy  History obtained from: chart review and patient.  For Review, LV was on 07/07/23  with Dr. Lucie Leather seen for routine follow-up. See below for summary of history and diagnostics.   Therapeutic plans/changes recommended: Rinvoq restarted   Today presents for follow-up. Discussed the use of AI scribe software for clinical note transcription with the patient, who gave verbal consent to proceed.  History of Present Illness   Megan Lynn, a patient with a history of severe eczema, presents for a lab check following the recent restart of Rinvoq. The patient had previously been on Rinvoq, but had to discontinue due to a toe issue. Following the discontinuation, the patient experienced a flare of eczema, prompting the decision to restart Rinvoq. Since restarting, the patient reports that the medication has been effective, but notes that it is expensive and has been trying to stretch the medication by taking half a pill. However, this approach has led to inconsistent control of the eczema, with flares occurring when the medication level in the system drops. The flares manifest as breakouts in various locations, including the neck and chest and eyelid.   The patient has a history of being on Dupixent for several years and has been a long-term patient of Dr. Lucie Leather. The patient also has a history of long-term prednisone use, which led to significant vision loss and bone fractures. The patient reports that Rinvoq has a similar rapid effect to prednisone in controlling the eczema.  The patient expresses concern about the safety profile of Rinvoq after reading about potential side effects. However, the patient  acknowledges that Rinvoq has been effective in managing the eczema and appreciates that it is a pill rather than an injection. The patient is currently trying to get patient assistance for the medication due to the high copay.         All medications reviewed by clinical staff and updated in chart. No new pertinent medical or surgical history except as noted in HPI.  ROS: All others negative except as noted per HPI.   Objective:  BP 130/84 (BP Location: Left Arm, Patient Position: Sitting, Cuff Size: Normal)   Pulse 99   Temp 97.8 F (36.6 C) (Temporal)   Resp 17   Wt 212 lb 9.6 oz (96.4 kg)   LMP 04/06/2014   SpO2 99%   BMI 31.03 kg/m  Body mass index is 31.03 kg/m. Physical Exam: General Appearance:  Alert, cooperative, no distress, appears stated age  Head:  Normocephalic, without obvious abnormality, atraumatic  Eyes:  Conjunctiva clear, EOM's intact  Ears EACs normal bilaterally  Nose: Nares normal, normal mucosa, no visible anterior polyps, and septum midline  Throat: Lips, tongue normal; teeth and gums normal, normal posterior oropharynx  Neck: Supple, symmetrical  Lungs:   clear to auscultation bilaterally, Respirations unlabored, no coughing  Heart:  regular rate and rhythm and no murmur, Appears well perfused  Extremities: No edema  Skin: erythematous, dry patches scattered on chest, right hand and eyelids and no rashes or lesions on visualized portions of skin  Neurologic: No gross deficits   Labs:  Lab Orders         Lipid Panel w/o Chol/HDL Ratio         CBC  With Diff/Platelet         Viral Hepatitis HBV, HCV         QuantiFERON-TB Gold Plus     Spirometry:  Tracings reviewed. Her effort: Good reproducible efforts. FVC: 2.96 L FEV1: 2.35 L, 74% predicted FEV1/FVC ratio: 79% Interpretation: Spirometry consistent with normal pattern.  Please see scanned spirometry results for details.   Assessment/Plan    1.  Continue RinVoq 15 mg - 1 tablet 1 time  per day (1/2 tablet 1 time per day if needed)  Labs ordered today to submit for approval: Lipid, CBC, quantiferon gold, hepatitis panel   2.  If needed:   A.  Mometasone 0.1% cream + tacrolimus 0.1% 1-2 times per day  B. Opzelura 1-2 times daily for eyelid flares   -sample given   B.  Hydroxyzine 25 mg 2 times per day  E.  Flonase 1-2 sprays each nostril 1 time per day  F.  Auvi-Q 0.3  G. Airsupra - 2 inhalations every 6 hours  3. Return to clinic in  3-6 months   Other: samples provided of: Rinvoq, Opzulura  Thank you so much for letting me partake in your care today.  Don't hesitate to reach out if you have any additional concerns!  Ferol Luz, MD  [facility] [place] [place]

## 2023-08-22 ENCOUNTER — Telehealth: Payer: Self-pay | Admitting: *Deleted

## 2023-08-22 NOTE — Telephone Encounter (Signed)
Patient is going to reach out once more time to the assistance program and if she can't get there will fill at [facility]

## 2023-08-22 NOTE — Telephone Encounter (Signed)
Called patient to discuss her options with Rinvoq. Denyse Amass had reached out to her in November to advise they needed income info on not just her but spouse in order to access for patient assistance. If she wants to not bother with PAP she will pay $100 copay for Rx at [facility]

## 2023-08-22 NOTE — Telephone Encounter (Signed)
Hello!  Pharmacist would like to confirm if pharmacy should keep new rx on hold until labs result, or if ok to refill for patient.  Please advise.

## 2023-08-23 NOTE — Telephone Encounter (Signed)
Spoke to patient and she will contact [employer] regarding PAP if not she can reach back out to me regarding paying for Rx

## 2023-08-23 NOTE — Telephone Encounter (Signed)
Thank you for the update!

## 2023-08-27 LAB — LIPID PANEL W/O CHOL/HDL RATIO
Cholesterol, Total: 275 mg/dL — ABNORMAL HIGH (ref 100–199)
HDL: 58 mg/dL (ref >39)
LDL Chol Calc (NIH): 162 mg/dL — ABNORMAL HIGH (ref 0–99)
Triglycerides: 292 mg/dL — ABNORMAL HIGH (ref 0–149)
VLDL Cholesterol Cal: 55 mg/dL — ABNORMAL HIGH (ref 5–40)

## 2023-08-27 LAB — CBC WITH DIFF/PLATELET
Basophils Absolute: 0 10*3/uL (ref 0.0–0.2)
Basos: 1 %
EOS (ABSOLUTE): 0.2 10*3/uL (ref 0.0–0.4)
Eos: 4 %
Hematocrit: 41.4 % (ref 34.0–46.6)
Hemoglobin: 14.1 g/dL (ref 11.1–15.9)
Immature Grans (Abs): 0 10*3/uL (ref 0.0–0.1)
Immature Granulocytes: 0 %
Lymphocytes Absolute: 2.8 10*3/uL (ref 0.7–3.1)
Lymphs: 47 %
MCH: 30.9 pg (ref 26.6–33.0)
MCHC: 34.1 g/dL (ref 31.5–35.7)
MCV: 91 fL (ref 79–97)
Monocytes Absolute: 0.4 10*3/uL (ref 0.1–0.9)
Monocytes: 7 %
Neutrophils Absolute: 2.3 10*3/uL (ref 1.4–7.0)
Neutrophils: 41 %
Platelets: 324 10*3/uL (ref 150–450)
RBC: 4.57 x10E6/uL (ref 3.77–5.28)
RDW: 13.5 % (ref 11.7–15.4)
WBC: 5.8 10*3/uL (ref 3.4–10.8)

## 2023-08-27 LAB — VIRAL HEPATITIS HBV, HCV
HCV Ab: NONREACTIVE
Hep B Core Total Ab: NEGATIVE
Hep B Surface Ab, Qual: NONREACTIVE
Hepatitis B Surface Ag: NEGATIVE

## 2023-08-27 LAB — QUANTIFERON-TB GOLD PLUS
QuantiFERON Mitogen Value: >10 [IU]/mL
QuantiFERON Nil Value: 0.91 [IU]/mL
QuantiFERON TB1 Ag Value: 0.76 [IU]/mL
QuantiFERON TB2 Ag Value: 0.45 [IU]/mL
QuantiFERON-TB Gold Plus: NEGATIVE

## 2023-08-27 LAB — HCV INTERPRETATION

## 2023-09-07 ENCOUNTER — Ambulatory Visit: Payer: Medicare PPO | Admitting: Podiatry

## 2023-09-07 ENCOUNTER — Encounter: Payer: Self-pay | Admitting: Podiatry

## 2023-09-07 DIAGNOSIS — L97512 Non-pressure chronic ulcer of other part of right foot with fat layer exposed: Secondary | ICD-10-CM | POA: Diagnosis not present

## 2023-09-07 NOTE — Progress Notes (Signed)
   Chief Complaint  Patient presents with   Routine Post Op    Patient states her foot has been better and she doesn't think it is infected any more patient states . No discomfort     Subjective:  53 y.o. female with PMHx of diabetes mellitus presenting today for follow-up evaluation of an ulcer to the plantar aspect of the fifth MTP of the right foot.  Patient states that she is doing significantly better.  She believes that the offloading felt dancers pads helped significantly reduce pressure from the foot.  Presenting for further treatment evaluation  Past Medical History:  Diagnosis Date   Asthma    Diabetes (HCC)    Eczema    Food allergy    Glaucoma     Past Surgical History:  Procedure Laterality Date   CHOLECYSTECTOMY  01/2023   GLAUCOMA SURGERY     TOE AMPUTATION Left 05/2023   TOE SURGERY Left 04/2018   TOE SURGERY Left    TYMPANOSTOMY TUBE PLACEMENT Bilateral 2020   eyes    Allergies  Allergen Reactions   Ciprofloxacin Hives and Shortness Of Breath   Doxycycline  Shortness Of Breath and Itching   Erythromycin Shortness Of Breath and Rash   Shellfish Allergy Shortness Of Breath and Rash   Sulfa Antibiotics Shortness Of Breath, Rash and Anaphylaxis    RT foot 08/17/2023   Objective/Physical Exam General: The patient is alert and oriented x3 in no acute distress.  Dermatology:  Significant improvement since last visit.  There is some dried scab overlying the wound with callus tissue but there is no open wound today.  Overall significant improvement and reduction of the erythema and wound  Vascular: Palpable pedal pulses bilaterally. No edema or erythema noted. Capillary refill within normal limits.  Neurological: Light touch and protective threshold absent bilaterally.   Musculoskeletal Exam: Prior amputation to the left second toe.  Plantarflexed metatarsals also noted with significant pressure to the first and fifth MTP bilateral  Assessment: 1.  Ulcer  plantar aspect of the fifth MTP right foot secondary to diabetes mellitus 2. diabetes mellitus w/ peripheral neuropathy   Plan of Care:  -Patient evaluated - Continue offloading using the felt dancers pads and advised against going barefoot -Light debridement was performed today using a 312 scalpel without incident or bleeding. -Return to clinic 4 weeks  Thresa EMERSON Sar, DPM [facility]  Dr. Thresa EMERSON Sar, DPM    [address] [place], [place] [address]                Office [phone]  Fax [phone]

## 2023-09-08 ENCOUNTER — Other Ambulatory Visit: Payer: Self-pay

## 2023-09-08 ENCOUNTER — Other Ambulatory Visit (HOSPITAL_COMMUNITY): Payer: Self-pay

## 2023-09-15 ENCOUNTER — Encounter (HOSPITAL_COMMUNITY): Payer: Self-pay

## 2023-09-15 ENCOUNTER — Other Ambulatory Visit: Payer: Self-pay

## 2023-09-16 ENCOUNTER — Other Ambulatory Visit: Payer: Self-pay

## 2023-09-16 ENCOUNTER — Telehealth: Payer: Self-pay

## 2023-09-16 ENCOUNTER — Other Ambulatory Visit (HOSPITAL_COMMUNITY): Payer: Self-pay

## 2023-09-16 MED ORDER — RINVOQ 15 MG PO TB24: 1.0000 | ORAL_TABLET | Freq: Every day | ORAL | Status: DC

## 2023-09-16 NOTE — Progress Notes (Signed)
Patient called back as she has only 2 days of samples remaining. Advised that rx will now need updated prior authorization and that Tammy will not be back in office until 1/20 and it may take a few days to complete. [employer]. Completed [employer] onboarding today and patient aware [facility] [employer] will call back to schedule fill and collect payment once approved.

## 2023-09-16 NOTE — Telephone Encounter (Signed)
Patient called stating there was an issue with her prescription for Rinvoq. She is aware that Babette Relic is out of office until 1/20 and asked if we could provide her with a sample since she only has 1 pill left.   She will pick up Rinvoq samples in our [place] office today.

## 2023-09-16 NOTE — Progress Notes (Signed)
Specialty Pharmacy Initiation Note   Megan Lynn is a 53 y.o. female who will be followed by the specialty pharmacy service for RxSp Atopic Dermatitis    Review of administration, indication, effectiveness, safety, potential side effects, storage/disposable, and missed dose instructions occurred today for patient's specialty medication(s) No data recorded    Patient/Caregiver did not have any additional questions or concerns.   Patient's therapy is appropriate to: Continue    Goals Addressed             This Visit's Progress    Reduce signs and symptoms       Patient is on track. Patient will maintain adherence. Patient already established on therapy with samples.          Otto Herb Specialty Pharmacist

## 2023-09-16 NOTE — Progress Notes (Unsigned)
Medication Samples have been provided to the patient.  Drug name: Rinvoq       Strength: ***        Qty: ***  LOT: ***  Exp.Date: ***  Dosing instructions: ***  The patient has been instructed regarding the correct time, dose, and frequency of taking this medication, including desired effects and most common side effects.   Megan Lynn 3:26 PM 09/16/2023

## 2023-09-19 NOTE — Telephone Encounter (Signed)
Medication Samples have been provided to the patient.  Drug name: Rinvoq       Strength: 15 mg        Qty: 28  LOT: [id number]  Exp.Date: 11-05-2024  Dosing instructions: take one by mouth tablet daily.  The patient has been instructed regarding the correct time, dose, and frequency of taking this medication, including desired effects and most common side effects.   Megan Lynn 8:48 AM 09/19/2023

## 2023-09-21 ENCOUNTER — Other Ambulatory Visit: Payer: Self-pay | Admitting: Family Medicine

## 2023-09-21 DIAGNOSIS — E7800 Pure hypercholesterolemia, unspecified: Secondary | ICD-10-CM

## 2023-09-29 ENCOUNTER — Ambulatory Visit
Admission: RE | Admit: 2023-09-29 | Discharge: 2023-09-29 | Disposition: A | Discharge status: Home / Self Care | Payer: Self-pay | Source: Ambulatory Visit | Attending: Family Medicine | Admitting: Family Medicine

## 2023-09-29 DIAGNOSIS — E7800 Pure hypercholesterolemia, unspecified: Secondary | ICD-10-CM | POA: Insufficient documentation

## 2023-10-03 ENCOUNTER — Ambulatory Visit: Payer: Medicare PPO | Admitting: Podiatry

## 2023-10-03 ENCOUNTER — Encounter: Payer: Self-pay | Admitting: Podiatry

## 2023-10-03 DIAGNOSIS — L97512 Non-pressure chronic ulcer of other part of right foot with fat layer exposed: Secondary | ICD-10-CM

## 2023-10-03 NOTE — Progress Notes (Unsigned)
   Chief Complaint  Patient presents with   Routine Post Op    Patient states her bilateral feet have been a lot better they are getting better and better.     Subjective:  53 y.o. female with PMHx of diabetes mellitus presenting today for follow-up evaluation of an ulcer to the plantar aspect of the fifth MTP of the right foot.  Patient states that she is doing significantly better.  She believes that the offloading felt dancers pads helped significantly reduce pressure from the foot.  Presenting for further treatment evaluation  Past Medical History:  Diagnosis Date   Asthma    Diabetes (HCC)    Eczema    Food allergy    Glaucoma     Past Surgical History:  Procedure Laterality Date   CHOLECYSTECTOMY  01/2023   GLAUCOMA SURGERY     TOE AMPUTATION Left 05/2023   TOE SURGERY Left 04/2018   TOE SURGERY Left    TYMPANOSTOMY TUBE PLACEMENT Bilateral 2020   eyes    Allergies  Allergen Reactions   Ciprofloxacin Hives and Shortness Of Breath   Doxycycline Shortness Of Breath and Itching   Erythromycin Shortness Of Breath and Rash   Shellfish Allergy Shortness Of Breath and Rash   Sulfa Antibiotics Shortness Of Breath, Rash and Anaphylaxis    RT foot 08/17/2023   Objective/Physical Exam General: The patient is alert and oriented x3 in no acute distress.  Dermatology:  Significant improvement since last visit.  There is some dried scab overlying the wound with callus tissue but there is no open wound today.  Overall significant improvement and reduction of the erythema and wound  Vascular: Palpable pedal pulses bilaterally. No edema or erythema noted. Capillary refill within normal limits.  Neurological: Light touch and protective threshold absent bilaterally.   Musculoskeletal Exam: Prior amputation to the left second toe.  Plantarflexed metatarsals also noted with significant pressure to the first and fifth MTP bilateral  Assessment: 1.  Ulcer plantar aspect of the  fifth MTP right foot secondary to diabetes mellitus 2. diabetes mellitus w/ peripheral neuropathy   Plan of Care:  -Patient evaluated - Continue offloading using the felt dancers pads and advised against going barefoot -Light debridement was performed today using a 312 scalpel without incident or bleeding. -Return to clinic 4 weeks  Felecia Shelling, DPM [facility]  Dr. Felecia Shelling, DPM    [address] [place], [place] [address]                Office [phone]  Fax [phone]

## 2023-10-13 ENCOUNTER — Telehealth: Payer: Self-pay | Admitting: *Deleted

## 2023-10-13 NOTE — Telephone Encounter (Signed)
-----   Message from ERIC J KOZLOW sent at 10/06/2023  7:13 AM EST ----- Megan Lynn, please initiate Nemolizumab treatment as she appears to have developed lipid issues with Rinvoq and dupilumab was ineffective.

## 2023-10-13 NOTE — Telephone Encounter (Signed)
L/m for patient to contact me to discuss patient assistance for [facility]

## 2023-10-14 NOTE — Telephone Encounter (Signed)
Spoke to patient and due to income changes she will not qualify for PAP so she is going to sign up for the [facility] and reach back out to me so I can send script to [facility]

## 2023-10-17 MED ORDER — NEMLUVIO 30 MG ~~LOC~~ AUIJ: 60.0000 mg | AUTO-INJECTOR | Freq: Once | SUBCUTANEOUS | 11 refills | Status: AC

## 2023-10-17 NOTE — Telephone Encounter (Signed)
 Patient l/m she has signed up for the [facility] I l/m advising will send Nemluvio to [facility] and they will reach out to send her medication

## 2023-10-17 NOTE — Addendum Note (Signed)
 Addended by: Devoria Glassing on: 10/17/2023 02:30 PM   Modules accepted: Orders

## 2023-11-13 ENCOUNTER — Encounter: Payer: Self-pay | Admitting: Allergy and Immunology

## 2023-11-14 ENCOUNTER — Ambulatory Visit (INDEPENDENT_AMBULATORY_CARE_PROVIDER_SITE_OTHER): Admitting: Family Medicine

## 2023-11-14 ENCOUNTER — Other Ambulatory Visit: Payer: Self-pay

## 2023-11-14 ENCOUNTER — Encounter: Payer: Self-pay | Admitting: Family Medicine

## 2023-11-14 VITALS — BP 114/76 | HR 109 | Temp 98.0°F | Resp 18

## 2023-11-14 DIAGNOSIS — T7800XD Anaphylactic reaction due to unspecified food, subsequent encounter: Secondary | ICD-10-CM | POA: Diagnosis not present

## 2023-11-14 DIAGNOSIS — J3089 Other allergic rhinitis: Secondary | ICD-10-CM

## 2023-11-14 DIAGNOSIS — R040 Epistaxis: Secondary | ICD-10-CM | POA: Insufficient documentation

## 2023-11-14 DIAGNOSIS — T7800XA Anaphylactic reaction due to unspecified food, initial encounter: Secondary | ICD-10-CM | POA: Insufficient documentation

## 2023-11-14 DIAGNOSIS — B9689 Other specified bacterial agents as the cause of diseases classified elsewhere: Secondary | ICD-10-CM

## 2023-11-14 DIAGNOSIS — J302 Other seasonal allergic rhinitis: Secondary | ICD-10-CM | POA: Insufficient documentation

## 2023-11-14 DIAGNOSIS — L2089 Other atopic dermatitis: Secondary | ICD-10-CM | POA: Diagnosis not present

## 2023-11-14 DIAGNOSIS — J4530 Mild persistent asthma, uncomplicated: Secondary | ICD-10-CM | POA: Diagnosis not present

## 2023-11-14 DIAGNOSIS — J019 Acute sinusitis, unspecified: Secondary | ICD-10-CM | POA: Diagnosis not present

## 2023-11-14 MED ORDER — AMOXICILLIN-POT CLAVULANATE 875-125 MG PO TABS: 1.0000 | ORAL_TABLET | Freq: Two times a day (BID) | ORAL | 0 refills | Status: AC

## 2023-11-14 NOTE — Progress Notes (Signed)
 [address] [place] [address] Dept: [phone]  FOLLOW UP NOTE  Patient ID: Megan Lynn, female    DOB: Aug 26, 1971  Age: 53 y.o. MRN: [id number] Date of Office Visit: 11/14/2023  Assessment  Chief Complaint: Sore Throat, Hoarse, Nasal Congestion, and Rash  HPI Megan Lynn is a 53 year old female who presents to the clinic for an acute evaluation of cough and sinus issues.  She was last seen in this clinic on 08/19/2023 by Dr. Marlynn Perking for evaluation of asthma, allergic rhinitis, atopic dermatitis, and food allergy to shellfish.  In the interim, she reports that she began to experience symptoms including sore throat and cough that began on Tuesday.  She reports laryngitis and cough producing thick, dark, yellow sputum that began on Wednesday.  At today's visit, she reports her asthma has been moderately well-controlled with no shortness of breath or wheeze with activity or rest.  She does report cough producing thick, dark, yellow mucus that began on Wednesday.  She has used albuterol 2 times this week with no relief of symptoms.  She does have Airsupra, however, continues to use albuterol.  She reports intermittent headache and pressure under both eyes that began early last week.  She reports nasal congestion and thick, dark yellow nasal drainage. She reports that she did feel feverish, experienced body aches, and chills on Thursday or Friday. She reports no sick contacts and her husband is not experiencing symptoms at this time.  She reports a negative home COVID test taken earlier today.    Allergic rhinitis is reported as moderately well controlled with infrequent sneezing and frequent post nasal drainage.  She continues Flonase daily and occasionally uses nasal saline rinses.  She reports excellent Flonase application technique.  She does report frequent episodes of blood streaks on the tissue after blowing her nose that began about 2 weeks ago.  She continues to  avoid shellfish with no accidental ingestion or EpiPen use since her last visit to this clinic.  She continues to experience red, dry, and eczematous areas occurring in a flare in remission pattern.  Her last Rinvoq 15 mg tablet was on Saturday.  She has recently been approved for Nemluvio injections.  She continues a twice a day moisturizing routine continues to use Protopic as well as mometasone as needed with only moderate relief of symptoms at this time.  Her current medications are listed in the chart.  Drug Allergies:  Allergies  Allergen Reactions   Ciprofloxacin Hives and Shortness Of Breath   Doxycycline Shortness Of Breath and Itching   Erythromycin Shortness Of Breath and Rash   Shellfish Allergy Shortness Of Breath and Rash   Sulfa Antibiotics Shortness Of Breath, Rash and Anaphylaxis    Physical Exam: BP 114/76 (BP Location: Left Arm, Patient Position: Sitting, Cuff Size: Normal)   Pulse (!) 109   Temp 98 F (36.7 C) (Temporal)   Resp 18   LMP 04/06/2014   SpO2 98%    Physical Exam Vitals reviewed.  Constitutional:      Appearance: Normal appearance.  HENT:     Head: Normocephalic and atraumatic.     Right Ear: Tympanic membrane normal.     Left Ear: Tympanic membrane normal.     Nose:     Comments: Bilateral nares slightly erythematous with thick nasal drainage noted.  Pharynx slightly erythematous with no exudate.  Ears normal.  Eyes normal.    Mouth/Throat:     Pharynx: Oropharynx is clear.  Eyes:  Conjunctiva/sclera: Conjunctivae normal.  Cardiovascular:     Rate and Rhythm: Normal rate and regular rhythm.     Heart sounds: Normal heart sounds. No murmur heard. Pulmonary:     Effort: Pulmonary effort is normal.     Breath sounds: Normal breath sounds.     Comments: Lungs clear to auscultation Musculoskeletal:        General: Normal range of motion.     Cervical back: Normal range of motion and neck supple.  Skin:    General: Skin is warm and dry.   Neurological:     Mental Status: She is alert and oriented to person, place, and time.  Psychiatric:        Mood and Affect: Mood normal.        Behavior: Behavior normal.        Thought Content: Thought content normal.        Judgment: Judgment normal.     Diagnostics: Deferred due to recent viral-like illness  Assessment and Plan: 1. Asthma, well controlled, mild persistent   2. Acute bacterial sinusitis   3. Other atopic dermatitis   4. Allergy with anaphylaxis due to food   5. Seasonal and perennial allergic rhinitis   6. Epistaxis     Meds ordered this encounter  Medications   amoxicillin-clavulanate (AUGMENTIN) 875-125 MG tablet    Sig: Take 1 tablet by mouth 2 (two) times daily for 10 days.    Dispense:  20 tablet    Refill:  0    Patient Instructions  Asthma Continue Airsupra 2 puffs if needed for shortness of breath or cough.  Do not use Airsupra more than 12 puffs in a 24-hour. If your breathing worsens, we will move forward with chest x-ray  Acute bacterial sinusitis Begin Augmentin 875 mg twice a day for 10 days Continue nasal saline rinses once or twice a day Continue Flonase nasal spray 2 sprays in each nostril once a day.  Do not use this medication if you are having a nosebleed  Allergic rhinitis Continue an antihistamine once a day if needed for runny nose or itch Continue Flonase 2 sprays in each nostril once a day if needed for stuffy nose.  In the right nostril, point the applicator out toward the right ear. In the left nostril, point the applicator out toward the left ear Do not use Flonase if you are experiencing nosebleeds Consider saline nasal rinses as needed for nasal symptoms. Use this before any medicated nasal sprays for best result  Atopic dermatitis Continue a twice a day moisturizing routine Call the clinic in about 1 week and if feeing better will discuss Nemluvio injection schedule  Food allergy Continue to avoid shellfish.  In  case of an allergic reaction, take Benadryl 50 mg every 4 hours, and if life-threatening symptoms occur, inject with EpiPen 0.3 mg.  Epistaxis Pinch both nostrils while leaning forward for at least 5 minutes before checking to see if the bleeding has stopped. If bleeding is not controlled within 5-10 minutes apply a cotton ball soaked with oxymetazoline (Afrin) to the bleeding nostril for a few seconds.  If the problem persists or worsens a referral to ENT for further evaluation may be necessary.  Call the clinic if this treatment plan is not working well for you  Follow up in 1 month or sooner if needed.   Return in about 4 weeks (around 12/12/2023), or if symptoms worsen or fail to improve.    Thank you for  the opportunity to care for this patient.  Please do not hesitate to contact me with questions.  Thermon Leyland, FNP [facility]

## 2023-11-14 NOTE — Patient Instructions (Addendum)
 Asthma Continue Airsupra 2 puffs if needed for shortness of breath or cough.  Do not use Airsupra more than 12 puffs in a 24-hour. If your breathing worsens, we will move forward with chest x-ray  Acute bacterial sinusitis Begin Augmentin 875 mg twice a day for 10 days Continue nasal saline rinses once or twice a day Continue Flonase nasal spray 2 sprays in each nostril once a day.  Do not use this medication if you are having a nosebleed  Allergic rhinitis Continue an antihistamine once a day if needed for runny nose or itch Continue Flonase 2 sprays in each nostril once a day if needed for stuffy nose.  In the right nostril, point the applicator out toward the right ear. In the left nostril, point the applicator out toward the left ear Do not use Flonase if you are experiencing nosebleeds Consider saline nasal rinses as needed for nasal symptoms. Use this before any medicated nasal sprays for best result  Atopic dermatitis Continue a twice a day moisturizing routine Call the clinic in about 1 week and if feeing better will discuss Nemluvio injection schedule  Food allergy Continue to avoid shellfish.  In case of an allergic reaction, take Benadryl 50 mg every 4 hours, and if life-threatening symptoms occur, inject with EpiPen 0.3 mg.  Epistaxis Pinch both nostrils while leaning forward for at least 5 minutes before checking to see if the bleeding has stopped. If bleeding is not controlled within 5-10 minutes apply a cotton ball soaked with oxymetazoline (Afrin) to the bleeding nostril for a few seconds.  If the problem persists or worsens a referral to ENT for further evaluation may be necessary.  Call the clinic if this treatment plan is not working well for you  Follow up in 1 month or sooner if needed.

## 2023-11-15 NOTE — Telephone Encounter (Signed)
-----   Message from Thermon Leyland sent at 11/15/2023  8:38 AM EDT ----- Can you please call to check on how this patient is feeling? Started abx last night for sinusitis. Thank you

## 2023-11-15 NOTE — Telephone Encounter (Signed)
Patient was seen in office yesterday 

## 2023-11-16 ENCOUNTER — Other Ambulatory Visit (HOSPITAL_COMMUNITY): Payer: Self-pay

## 2023-11-16 ENCOUNTER — Telehealth: Payer: Self-pay

## 2023-11-16 NOTE — Telephone Encounter (Signed)
*  Asthma/Allergy  Pharmacy Patient Advocate Encounter  Received notification from [facility] that Prior Authorization for Airsupra 90-80MCG/ACT aerosol  has been APPROVED from 11/16/2023 to 08/29/2024. Ran test claim, Copay is $0.00. This test claim was processed through [facility]- copay amounts may vary at other pharmacies due to pharmacy/plan contracts, or as the patient moves through the different stages of their insurance plan.   PA #/Case ID/Reference #: [id number]

## 2023-11-16 NOTE — Telephone Encounter (Signed)
 Spoke with patient, informed her that Paulene Floor has been approved and pharmacy has been made aware. Patient verbalized understanding and will reach out to the pharmacy to pick it up.

## 2023-11-16 NOTE — Telephone Encounter (Signed)
 Patient is unable to sleep at night from the coughing. She is getting better overall but at this point she is incredibly sleep deprived. She has tried over the counter cough remedies but she is still coughing. Please advise on a prescription for cough medicine?

## 2023-11-17 ENCOUNTER — Telehealth: Payer: Self-pay | Admitting: Family Medicine

## 2023-11-17 NOTE — Telephone Encounter (Signed)
 Please apologize for the delay as I was out of the office yesterday. Can you please ask if she is still coughing, if she has developed a fever? Thank you. Happy to send a cough med if she needs one still.

## 2023-11-17 NOTE — Telephone Encounter (Signed)
 LMOM for patient to call the clinic regarding her symptoms of cough.

## 2023-11-24 ENCOUNTER — Encounter: Payer: Self-pay | Admitting: Allergy and Immunology

## 2023-11-24 ENCOUNTER — Ambulatory Visit: Payer: Medicare PPO | Admitting: Allergy and Immunology

## 2023-11-24 VITALS — BP 120/78 | HR 104 | Resp 14

## 2023-11-24 DIAGNOSIS — T7800XD Anaphylactic reaction due to unspecified food, subsequent encounter: Secondary | ICD-10-CM | POA: Diagnosis not present

## 2023-11-24 DIAGNOSIS — J3089 Other allergic rhinitis: Secondary | ICD-10-CM

## 2023-11-24 DIAGNOSIS — L2089 Other atopic dermatitis: Secondary | ICD-10-CM

## 2023-11-24 DIAGNOSIS — J4530 Mild persistent asthma, uncomplicated: Secondary | ICD-10-CM

## 2023-11-24 DIAGNOSIS — T7800XA Anaphylactic reaction due to unspecified food, initial encounter: Secondary | ICD-10-CM

## 2023-11-24 DIAGNOSIS — J302 Other seasonal allergic rhinitis: Secondary | ICD-10-CM

## 2023-11-24 MED ORDER — OPZELURA 1.5 % EX CREA: TOPICAL_CREAM | CUTANEOUS | 5 refills | Status: DC

## 2023-11-24 MED ORDER — AUVI-Q 0.3 MG/0.3ML IJ SOAJ: INTRAMUSCULAR | 1 refills | Status: AC

## 2023-11-24 NOTE — Patient Instructions (Addendum)
  1.  Nemolizumab injections every 4 weeks  2.  If needed:   A.  Mometasone 0.1% cream - 1-2 times per day  B.  Opzulera - apply around eyes 1-2 times per day  C.  Hydroxyzine 25 mg 2 times per day  D.  Ryaltris - 2 sprays each nostril 1-2 time per day  E.  Auvi-Q 0.3  F.  Airsupra - 2 inhalations every 6 hours  3. Return to clinic in 6 month or earlier if problem.

## 2023-11-24 NOTE — Progress Notes (Signed)
 [place] [facility] [place] - [place] - [place] - [place]   Follow-up Note  Referring Provider: [name], MD Primary Provider: [name], MD Date of Office Visit: 11/24/2023  Subjective:   Megan Lynn (DOB: 1971-03-02) is a 53 y.o. female who [facility]23 in re-evaluation of the following:  HPI: [name] returns to this clinic in evaluation of severe atopic dermatitis, asthma, allergic rhinitis, history shellfish allergy.  I last saw her in this clinic 07 July 2023.  She unfortunately developed a rather significant respiratory tract infection at the beginning of March and required evaluation in our clinic with our nurse practitioner on 15 November 2023 for which she was treated with Augmentin and fortunately her respiratory tract infection resolved.  In review, she has been using oral Jak inhibitor to treat her atopic dermatitis with a rather significant response with almost complete clearing of her skin other than the need to use some topical Jak inhibitor around her eyes and occasionally some topical steroids (intolerant of calcineurin inhibitors) and her respiratory tract issue has basically resolved.  But, she has had some serious infections while using an oral Jak inhibitor including a bone infection requiring amputation of her toe and then this rather significant respiratory tract infection.  We started her on nemolizumab injections to replace her oral Jak inhibitor and she has not used that oral Jak inhibitor over the course of the past several weeks.  She does not consume shellfish.  Allergies as of 11/24/2023       Reactions   Ciprofloxacin Hives, Shortness Of Breath   Doxycycline Shortness Of Breath, Itching   Erythromycin Shortness Of Breath, Rash   Shellfish Allergy Shortness Of Breath, Rash   Sulfa Antibiotics Shortness Of Breath, Rash, Anaphylaxis        Medication List    Airsupra 90-80 MCG/ACT  Aero Generic drug: Albuterol-Budesonide INHALE 2 PUFFS INTO LUNGS EVERY 6 HOURS AS NEEDED FOR COUGH , WHEEZING AND FOR SHORTNESS OF BREATH .  RINSE  GARGLE  AND  SPIT  AFTER  USE   albuterol 108 (90 Base) MCG/ACT inhaler Commonly known as: ProAir HFA Can inhale two puffs every four to six hours as needed for cough, wheeze, or shortness of breath.   amoxicillin-clavulanate 875-125 MG tablet Commonly known as: Augmentin Take 1 tablet by mouth 2 (two) times daily for 10 days.   Auvi-Q 0.3 MG/0.3ML Soaj injection Generic drug: EPINEPHrine Use as directed for life-threatening allergic reaction.   cetirizine 10 MG tablet Commonly known as: ZYRTEC Take 10 mg by mouth daily.   cyclobenzaprine 10 MG tablet Commonly known as: FLEXERIL Take 10 mg by mouth 3 (three) times daily as needed.   fluticasone 50 MCG/ACT nasal spray Commonly known as: FLONASE Place 1 spray into both nostrils 2 (two) times daily.   gentamicin cream 0.1 % Commonly known as: GARAMYCIN APPLY  CREAM TOPICALLY TO AFFECTED AREA TWICE DAILY   hydrOXYzine 25 MG tablet Commonly known as: ATARAX Can take 1 tablet by mouth twice daily.   mometasone 0.1 % cream Commonly known as: ELOCON Apply cream 1-2 times per day   Mounjaro 15 MG/0.5ML Pen Generic drug: tirzepatide SMARTSIG:15 Milligram(s) SUB-Q Once a Week   Nemluvio 30 MG SQ injection Generic drug: nemolizumab-ilto Inject into the skin.   Qvar RediHaler 80 MCG/ACT inhaler Generic drug: beclomethasone Inhale three puffs three times daily during asthma flare-up.  Rinse, gargle, and spit after use.   tacrolimus 0.1 %  ointment Commonly known as: PROTOPIC Apply ointment  1-2 times per day    Past Medical History:  Diagnosis Date   Asthma    Diabetes (HCC)    Eczema    Food allergy    Glaucoma     Past Surgical History:  Procedure Laterality Date   CHOLECYSTECTOMY  01/2023   GLAUCOMA SURGERY     TOE AMPUTATION Left 05/2023   TOE SURGERY Left  04/2018   TOE SURGERY Left    TYMPANOSTOMY TUBE PLACEMENT Bilateral 2020   eyes    Review of systems negative except as noted in HPI / PMHx or noted below:  Review of Systems  Constitutional: Negative.   HENT: Negative.    Eyes: Negative.   Respiratory: Negative.    Cardiovascular: Negative.   Gastrointestinal: Negative.   Genitourinary: Negative.   Musculoskeletal: Negative.   Skin: Negative.   Neurological: Negative.   Endo/Heme/Allergies: Negative.   Psychiatric/Behavioral: Negative.       Objective:   Vitals:   11/24/23 1148  BP: 120/78  Pulse: (!) 104  Resp: 14  SpO2: 98%          Physical Exam Constitutional:      Appearance: She is not diaphoretic.  HENT:     Head: Normocephalic.     Right Ear: Tympanic membrane, ear canal and external ear normal.     Left Ear: Tympanic membrane, ear canal and external ear normal.     Nose: Nose normal. No mucosal edema or rhinorrhea.     Mouth/Throat:     Pharynx: Uvula midline. No oropharyngeal exudate.  Eyes:     Conjunctiva/sclera: Conjunctivae normal.  Neck:     Thyroid: No thyromegaly.     Trachea: Trachea normal. No tracheal tenderness or tracheal deviation.  Cardiovascular:     Rate and Rhythm: Normal rate and regular rhythm.     Heart sounds: Normal heart sounds, S1 normal and S2 normal. No murmur heard. Pulmonary:     Effort: No respiratory distress.     Breath sounds: Normal breath sounds. No stridor. No wheezing or rales.  Lymphadenopathy:     Head:     Right side of head: No tonsillar adenopathy.     Left side of head: No tonsillar adenopathy.     Cervical: No cervical adenopathy.  Skin:    Findings: Rash (Multiple erythematous indurated patches torso extremities) present. No erythema.     Nails: There is no clubbing.  Neurological:     Mental Status: She is alert.     Diagnostics: none  Assessment and Plan:   1. Other atopic dermatitis   2. Asthma, well controlled, mild persistent   3.  Seasonal and perennial allergic rhinitis   4. Allergy with anaphylaxis due to food    1.  Nemolizumab injections every 4 weeks  2.  If needed:   A.  Mometasone 0.1% cream - 1-2 times per day  B.  Opzulera - apply around eyes 1-2 times per day  C.  Hydroxyzine 25 mg 2 times per day  D.  Ryaltris - 2 sprays each nostril 1-2 time per day  E.  Auvi-Q 0.3  F.  Airsupra - 2 inhalations every 6 hours  3. Return to clinic in 6 month or earlier if problem.   I do not think that Johnasia is a candidate for full dose oral Jak inhibitor given the side effects that have developed while utilizing this medication.  We will see what happens with  her anti-IL 31 monoclonal antibody in conjunction with a topical Jak inhibitor and topical steroids.  If she fails this therapy then maybe we can restart her oral Jak inhibitor but only at Monday Wednesday and Friday administration.   Laurette Schimke, MD Allergy / Immunology [facility]

## 2023-11-25 ENCOUNTER — Telehealth: Payer: Self-pay

## 2023-11-25 NOTE — Telephone Encounter (Signed)
*  Asthma/Allergy  Pharmacy Patient Advocate Encounter   Received notification from CoverMyMeds that prior authorization for Opzelura 1.5% cream  is required/requested.   Insurance verification completed.   The patient is insured through [place] [employer] .   Per test claim: PA required; PA submitted to above mentioned insurance via CoverMyMeds Key/confirmation #/EOC [id number] Status is pending

## 2023-11-28 ENCOUNTER — Encounter: Payer: Self-pay | Admitting: Allergy and Immunology

## 2023-11-28 NOTE — Telephone Encounter (Signed)
 PA Case: [id number], Status: Approved, Coverage Starts on: 08/31/2023 12:00:00 AM, Coverage Ends on: 08/29/2024 12:00:00 AM. Questions? Contact [phone].

## 2023-12-05 ENCOUNTER — Other Ambulatory Visit: Payer: Self-pay | Admitting: Family Medicine

## 2023-12-05 ENCOUNTER — Encounter: Payer: Self-pay | Admitting: Podiatry

## 2023-12-05 ENCOUNTER — Ambulatory Visit: Payer: Medicare PPO | Admitting: Podiatry

## 2023-12-05 DIAGNOSIS — L97512 Non-pressure chronic ulcer of other part of right foot with fat layer exposed: Secondary | ICD-10-CM | POA: Diagnosis not present

## 2023-12-05 MED ORDER — AMOXICILLIN-POT CLAVULANATE 875-125 MG PO TABS: 1.0000 | ORAL_TABLET | Freq: Two times a day (BID) | ORAL | 0 refills | Status: DC

## 2023-12-05 NOTE — Progress Notes (Unsigned)
   Chief Complaint  Patient presents with   Routine Post Op    RM6: 2 months surgery f/u/ maintenance    Subjective:  53 y.o. female with PMHx of diabetes mellitus presenting today for follow-up evaluation of an ulcer to the plantar aspect of the fifth MTP of the right foot.  Patient states that she is doing significantly better.  She continues to wear the offloading felt dancers pads which have helped significantly reduce pressure from the foot.  Presenting for further treatment evaluation  Past Medical History:  Diagnosis Date   Asthma    Diabetes (HCC)    Eczema    Food allergy    Glaucoma     Past Surgical History:  Procedure Laterality Date   CHOLECYSTECTOMY  01/2023   GLAUCOMA SURGERY     TOE AMPUTATION Left 05/2023   TOE SURGERY Left 04/2018   TOE SURGERY Left    TYMPANOSTOMY TUBE PLACEMENT Bilateral 2020   eyes    Allergies  Allergen Reactions   Ciprofloxacin Hives and Shortness Of Breath   Doxycycline Shortness Of Breath and Itching   Erythromycin Shortness Of Breath and Rash   Shellfish Allergy Shortness Of Breath and Rash   Sulfa Antibiotics Shortness Of Breath, Rash and Anaphylaxis    RT foot 08/17/2023   Objective/Physical Exam General: The patient is alert and oriented x3 in no acute distress.  Dermatology:  Continued improvement.  There is some hyperkeratotic callus tissue but after debridement there is no underlying wound.  The wound is completely healed and resolved  Vascular: Palpable pedal pulses bilaterally. No edema or erythema noted. Capillary refill within normal limits.  Neurological: Light touch and protective threshold absent bilaterally.   Musculoskeletal Exam: Prior amputation to the left second toe.  Plantarflexed metatarsals also noted with significant pressure to the first and fifth MTP bilateral  Assessment: 1.  Ulcer plantar aspect of the fifth MTP right foot secondary to diabetes mellitus 2. diabetes mellitus w/ peripheral  neuropathy   Plan of Care:  -Patient evaluated - Continue offloading using the felt dancers pads and advised against going barefoot -Light debridement was performed today using a 312 scalpel without incident or bleeding. - Multiple pairs of OTC prefabricated insoles were modified today to offload pressure from the forefoot using metatarsal pads. -Return to clinic 3 months routine footcare  Felecia Shelling, DPM [facility]  Dr. Felecia Shelling, DPM    [address] [place], [place] [address]                Office [phone]  Fax [phone]

## 2023-12-28 ENCOUNTER — Encounter: Payer: Self-pay | Admitting: Allergy and Immunology

## 2023-12-30 ENCOUNTER — Ambulatory Visit

## 2023-12-30 DIAGNOSIS — L209 Atopic dermatitis, unspecified: Secondary | ICD-10-CM | POA: Diagnosis not present

## 2023-12-30 MED ORDER — LEBRIKIZUMAB-LBKZ 250 MG/2ML ~~LOC~~ SOAJ
250.0000 mg | Freq: Once | SUBCUTANEOUS | Status: AC
  Administered 2023-12-30: 250 mg via SUBCUTANEOUS

## 2023-12-30 NOTE — Progress Notes (Signed)
 Immunotherapy   Patient Details  Name: Veletta Hargreaves MRN: [id number] Date of Birth: May 14, 1971  12/30/2023  Wyonia Hefty  started Ebglyss 250 mg injections. Sample was provided for loading dose (500 mg) Teaching was provided. Patient waited in office for 15 minutes without any issues.    Jaquavious Mercer 12/30/2023, 4:42 PM

## 2024-01-02 ENCOUNTER — Telehealth: Payer: Self-pay | Admitting: *Deleted

## 2024-01-02 MED ORDER — EBGLYSS 250 MG/2ML ~~LOC~~ SOSY: 250.0000 mg | PREFILLED_SYRINGE | SUBCUTANEOUS | 4 refills | Status: DC

## 2024-01-02 MED ORDER — EBGLYSS 250 MG/2ML ~~LOC~~ SOSY: 250.0000 mg | PREFILLED_SYRINGE | SUBCUTANEOUS | 11 refills | Status: DC

## 2024-01-02 MED ORDER — EBGLYSS 250 MG/2ML ~~LOC~~ SOSY: 500.0000 mg | PREFILLED_SYRINGE | Freq: Once | SUBCUTANEOUS | 0 refills | Status: AC

## 2024-01-02 NOTE — Telephone Encounter (Signed)
 My chart message to patient advising change to Ebglyss and Rx to [facility] to replace Nemluvio

## 2024-02-01 ENCOUNTER — Encounter: Payer: Self-pay | Admitting: Podiatry

## 2024-02-01 ENCOUNTER — Ambulatory Visit: Admitting: Podiatry

## 2024-02-01 VITALS — Ht 69.4 in | Wt 212.6 lb

## 2024-02-01 DIAGNOSIS — L97512 Non-pressure chronic ulcer of other part of right foot with fat layer exposed: Secondary | ICD-10-CM | POA: Diagnosis not present

## 2024-02-01 DIAGNOSIS — S98132A Complete traumatic amputation of one left lesser toe, initial encounter: Secondary | ICD-10-CM

## 2024-02-01 NOTE — Progress Notes (Signed)
   No chief complaint on file.   Subjective:  53 y.o. female with PMHx of diabetes mellitus presenting today for follow-up evaluation of an ulcer to the plantar aspect of the fifth MTP of the right foot.    Past Medical History:  Diagnosis Date   Asthma    Diabetes (HCC)    Eczema    Food allergy    Glaucoma     Past Surgical History:  Procedure Laterality Date   CHOLECYSTECTOMY  01/2023   GLAUCOMA SURGERY     TOE AMPUTATION Left 05/2023   TOE SURGERY Left 04/2018   TOE SURGERY Left    TYMPANOSTOMY TUBE PLACEMENT Bilateral 2020   eyes    Allergies  Allergen Reactions   Ciprofloxacin Hives and Shortness Of Breath   Doxycycline  Shortness Of Breath and Itching   Erythromycin Shortness Of Breath and Rash   Shellfish Allergy Shortness Of Breath and Rash   Sulfa Antibiotics Shortness Of Breath, Rash and Anaphylaxis    RT foot 08/17/2023   Objective/Physical Exam General: The patient is alert and oriented x3 in no acute distress.  Dermatology: After debridement of the hyperkeratotic callus and skin there is an ulcer present to the plantar aspect of the foot measuring approximately 1.5 x 1.5 x 0.3 cm.  Granular wound base.  No exposed bone.  Good potential for healing.  Vascular: Palpable pedal pulses bilaterally. No edema or erythema noted. Capillary refill within normal limits.  Neurological: Light touch and protective threshold absent bilaterally.   Musculoskeletal Exam: Prior amputation to the left second toe.  Plantarflexed metatarsals also noted with significant pressure to the first and fifth MTP bilateral  Assessment: 1.  Ulcer plantar aspect of the fifth MTP right foot secondary to diabetes mellitus 2. diabetes mellitus w/ peripheral neuropathy 3.  Intrinsic foot atrophy with high arches bilateral   Plan of Care:  -Patient evaluated - Continue offloading using the felt dancers pads and advised against going barefoot - Medically necessary excisional  debridement including subcutaneous tissue was performed today using a tissue nipper.  Excisional debridement of the necrotic nonviable tissue down to healthier bleeding viable tissue was performed with postdebridement measurement same as pre- -Order placed for custom diabetic insoles and diabetic shoes to [employer] [facility] -Patient has gentamicin  cream at home.  Recommend gentamicin  cream and a Band-Aid daily -Return to clinic 1 month  Dot Gazella, DPM [facility]  Dr. Dot Gazella, DPM    [address] [place], [place] [address]                Office [phone]  Fax [phone]

## 2024-02-23 ENCOUNTER — Other Ambulatory Visit: Payer: Self-pay | Admitting: Allergy and Immunology

## 2024-02-27 ENCOUNTER — Other Ambulatory Visit: Payer: Self-pay | Admitting: Allergy and Immunology

## 2024-02-29 ENCOUNTER — Ambulatory Visit: Admitting: Podiatry

## 2024-02-29 ENCOUNTER — Encounter: Payer: Self-pay | Admitting: Podiatry

## 2024-02-29 VITALS — Ht 69.4 in | Wt 212.6 lb

## 2024-02-29 DIAGNOSIS — M216X1 Other acquired deformities of right foot: Secondary | ICD-10-CM

## 2024-02-29 DIAGNOSIS — L97512 Non-pressure chronic ulcer of other part of right foot with fat layer exposed: Secondary | ICD-10-CM

## 2024-02-29 DIAGNOSIS — M205X1 Other deformities of toe(s) (acquired), right foot: Secondary | ICD-10-CM | POA: Diagnosis not present

## 2024-02-29 NOTE — Progress Notes (Signed)
 Chief Complaint  Patient presents with   Wound Check    Pt is here to f/u on right foot due to ulcer she states that she doesn't thinks its getting better and contiunes to hurt.    Subjective:  53 y.o. female with PMHx of diabetes mellitus presenting today for follow-up evaluation of an ulcer to the plantar aspect of the fifth and first MTP of the right foot.    Past Medical History:  Diagnosis Date   Asthma    Diabetes (HCC)    Eczema    Food allergy    Glaucoma     Past Surgical History:  Procedure Laterality Date   CHOLECYSTECTOMY  01/2023   GLAUCOMA SURGERY     TOE AMPUTATION Left 05/2023   TOE SURGERY Left 04/2018   TOE SURGERY Left    TYMPANOSTOMY TUBE PLACEMENT Bilateral 2020   eyes    Allergies  Allergen Reactions   Ciprofloxacin Hives and Shortness Of Breath   Doxycycline  Shortness Of Breath and Itching   Erythromycin Shortness Of Breath and Rash   Shellfish Allergy Shortness Of Breath and Rash   Sulfa Antibiotics Shortness Of Breath, Rash and Anaphylaxis      RT foot 02/29/2024   Objective/Physical Exam General: The patient is alert and oriented x3 in no acute distress.  Dermatology: There continues to be persistent chronic ulcers that developed to the plantar 1st and 5th MTP of the right foot greater than the left.  They are very stable for the moment.  No drainage.  No clinical indication of infection.  They do not extend to bone.  Vascular: Palpable pedal pulses bilaterally. No edema or erythema noted. Capillary refill within normal limits.  Neurological: Light touch and protective threshold absent bilaterally.   Musculoskeletal Exam: Prior amputation to the left second toe.  Plantarflexed metatarsals also noted with significant pressure to the first and fifth MTP bilateral RT > LT with mallet toe deformity of the great toe  Assessment: 1.  Ulcer plantar aspect of the first and fifth MTP right foot secondary to diabetes mellitus 2. diabetes  mellitus w/ peripheral neuropathy 3.  Intrinsic foot atrophy with high arches bilateral 4.  Plantarflexed metatarsals of the right foot   Plan of Care:  -Patient evaluated - Continue offloading using the felt dancers pads and advised against going barefoot - Medically necessary excisional debridement including subcutaneous tissue was performed today using a tissue nipper.  Excisional debridement of the necrotic nonviable tissue down to healthier bleeding viable tissue was performed with postdebridement measurement same as pre- -unfortunately the patient continues to have ulcers that developed with pain and tenderness to the plantar aspect of the right foot despite conservative care which includes routine debridements, appropriate offloading with diabetic insoles and additional padding, and long-term chronic wound care.  Unfortunately she continues to have ulcers develop and I do believe it is appropriate this time to discuss surgery.  The patient is very frustrated and is affecting her daily quality of life. -Today we discussed in detail surgery to correct for the right foot which would include fifth metatarsal head resection as well as Joshua tenosuspension to the right great toe.  Risk benefits advantages and disadvantages of the procedure as well as the postoperative recovery course were explained in detail.  No guarantees were expressed or implied.  All patient questions were answered.  The patient consents and would like to proceed with surgery -Authorization for surgery was initiated today.  Surgery will consist of Joshua  tenosuspension with arthrodesis of the IPJ right great toe.  Fifth metatarsal head resection right fifth toe. -Return to clinic 1 week postop  Thresa EMERSON Sar, DPM [facility]  Dr. Thresa EMERSON Sar, DPM    [address] [place], [place] [address]                Office [phone]  Fax [phone]

## 2024-03-04 ENCOUNTER — Encounter: Payer: Self-pay | Admitting: Allergy and Immunology

## 2024-03-07 ENCOUNTER — Telehealth: Payer: Self-pay | Admitting: Podiatry

## 2024-03-07 NOTE — Telephone Encounter (Signed)
 Received surgical consent  Pt scheduled for 8/14 for surgery and is on the ozempic and is aware it has to be stopped 1 wk prior to surgery. She is not on any blood thinners.  Pharmacy correct in chart.

## 2024-03-15 ENCOUNTER — Encounter: Payer: Self-pay | Admitting: Podiatry

## 2024-03-17 ENCOUNTER — Other Ambulatory Visit: Payer: Self-pay | Admitting: Allergy and Immunology

## 2024-03-21 ENCOUNTER — Other Ambulatory Visit: Payer: Self-pay | Admitting: Allergy and Immunology

## 2024-03-23 ENCOUNTER — Encounter: Payer: Self-pay | Admitting: Podiatry

## 2024-03-23 ENCOUNTER — Telehealth: Payer: Self-pay | Admitting: Podiatry

## 2024-03-23 ENCOUNTER — Ambulatory Visit: Admitting: Podiatry

## 2024-03-23 VITALS — Ht 69.4 in | Wt 212.6 lb

## 2024-03-23 DIAGNOSIS — L97512 Non-pressure chronic ulcer of other part of right foot with fat layer exposed: Secondary | ICD-10-CM | POA: Diagnosis not present

## 2024-03-23 MED ORDER — FLUCONAZOLE 150 MG PO TABS: 150.0000 mg | ORAL_TABLET | Freq: Once | ORAL | 0 refills | Status: DC

## 2024-03-23 MED ORDER — AMOXICILLIN-POT CLAVULANATE 875-125 MG PO TABS: 1.0000 | ORAL_TABLET | Freq: Two times a day (BID) | ORAL | 0 refills | Status: DC

## 2024-03-23 NOTE — Telephone Encounter (Signed)
 Dr. Janit- I am not sure how I missed this message. I am in the nurse pool and it was sent to me but still had your name on it, so I never looked at it. I was going through messages and just saw this! Looks like she is coming in today. Sorry!

## 2024-03-23 NOTE — Telephone Encounter (Signed)
 Patient states that the antibiotic was called into the pharmacy, but the Diflucan  was not. Please send the Diflucan  prescription thank you.

## 2024-03-23 NOTE — Progress Notes (Signed)
 Chief Complaint  Patient presents with   Wound Check    Pt is here due to right foot wound she states she believes that it is infected, was having drainage from it states it would soak thru the bandages and 2 socks, nos it is not draining as mush and is no longer painful but sore.    Subjective:  53 y.o. female with PMHx of diabetes mellitus presenting today for follow-up evaluation of an ulcer to the plantar aspect of the fifth and first MTP of the right foot.  Patient has noticed increased redness and swelling with drainage to the wound since last visit.  Past Medical History:  Diagnosis Date   Asthma    Diabetes (HCC)    Eczema    Food allergy    Glaucoma     Past Surgical History:  Procedure Laterality Date   CHOLECYSTECTOMY  01/2023   GLAUCOMA SURGERY     TOE AMPUTATION Left 05/2023   TOE SURGERY Left 04/2018   TOE SURGERY Left    TYMPANOSTOMY TUBE PLACEMENT Bilateral 2020   eyes    Allergies  Allergen Reactions   Ciprofloxacin Hives and Shortness Of Breath   Doxycycline  Shortness Of Breath and Itching   Erythromycin Shortness Of Breath and Rash   Shellfish Allergy Shortness Of Breath and Rash   Sulfa Antibiotics Shortness Of Breath, Rash and Anaphylaxis      RT foot 02/29/2024   Objective/Physical Exam General: The patient is alert and oriented x3 in no acute distress.  Dermatology: There continues to be persistent chronic ulcers that developed to the plantar 1st and 5th MTP of the right foot greater than the left.  They are very stable for the moment.  No drainage.   They do not extend to bone.  Vascular: Palpable pedal pulses bilaterally. No edema or erythema noted. Capillary refill within normal limits.  Mild localized erythema noted around the fifth MTP of the right foot today  Neurological: Light touch and protective threshold absent bilaterally.   Musculoskeletal Exam: Prior amputation to the left second toe.  Plantarflexed metatarsals also noted with  significant pressure to the first and fifth MTP bilateral RT > LT with mallet toe deformity of the great toe  Assessment: 1.  Ulcer plantar aspect of the first and fifth MTP right foot secondary to diabetes mellitus 2. diabetes mellitus w/ peripheral neuropathy 3.  Intrinsic foot atrophy with high arches bilateral 4.  Plantarflexed metatarsals of the right foot 5.  Mild localized cellulitis right foot  Plan of Care:  -Patient evaluated - Continue offloading using the felt dancers pads and advised against going barefoot - Medically necessary excisional debridement including subcutaneous tissue was performed today using a tissue nipper.  Excisional debridement of the necrotic nonviable tissue down to healthier bleeding viable tissue was performed with postdebridement measurement same as pre- -Authorization for surgery was initiated last visit on 02/29/2024.  Surgery will consist of Jones tenosuspension with arthrodesis of the IPJ right great toe.  Fifth metatarsal head resection right fifth toe. -Prescription for Augmentin  875/125 mg twice daily x 10 days -Return to clinic 1 week postop  Thresa EMERSON Sar, DPM [facility]  Dr. Thresa EMERSON Sar, DPM    [address]                                      [place], [place]  [address]                Office [phone]  Fax [phone]

## 2024-03-27 ENCOUNTER — Other Ambulatory Visit: Payer: Self-pay | Admitting: Podiatry

## 2024-03-27 ENCOUNTER — Other Ambulatory Visit: Payer: Self-pay | Admitting: Allergy and Immunology

## 2024-04-05 ENCOUNTER — Telehealth: Payer: Self-pay | Admitting: Podiatry

## 2024-04-05 NOTE — Telephone Encounter (Signed)
 DOS- 04/12/2024  TENOLYSIS GREAT TOE RT- 28760 5TH METATARSAL HEAD RES 5TH RT- 28113  [facility] EFFECTIVE DATE- 1/12023  DEDUCTIBLE- N/A OOP- $4000 REMAINING- $3660 COINSURANCE- 0%/$250 COPAY  PER [employer] AND [employer] WEBSITES, NO PRIOR AUTHORIZATIONS ARE REQUIRED FOR CPT CODES [phone] AND [phone]. [employer] TRANS ID- [id number]

## 2024-04-12 ENCOUNTER — Other Ambulatory Visit: Payer: Self-pay | Admitting: Podiatry

## 2024-04-12 DIAGNOSIS — M216X1 Other acquired deformities of right foot: Secondary | ICD-10-CM | POA: Diagnosis not present

## 2024-04-12 DIAGNOSIS — M205X1 Other deformities of toe(s) (acquired), right foot: Secondary | ICD-10-CM | POA: Diagnosis not present

## 2024-04-12 MED ORDER — OXYCODONE-ACETAMINOPHEN 5-325 MG PO TABS: 1.0000 | ORAL_TABLET | ORAL | 0 refills | Status: DC | PRN

## 2024-04-12 MED ORDER — AMOXICILLIN-POT CLAVULANATE 875-125 MG PO TABS: 1.0000 | ORAL_TABLET | Freq: Two times a day (BID) | ORAL | 0 refills | Status: DC

## 2024-04-12 MED ORDER — IBUPROFEN 800 MG PO TABS: 800.0000 mg | ORAL_TABLET | Freq: Three times a day (TID) | ORAL | 1 refills | Status: DC

## 2024-04-12 NOTE — Progress Notes (Signed)
 PRN postop

## 2024-04-13 ENCOUNTER — Other Ambulatory Visit: Payer: Self-pay | Admitting: Podiatry

## 2024-04-18 ENCOUNTER — Ambulatory Visit (INDEPENDENT_AMBULATORY_CARE_PROVIDER_SITE_OTHER): Admitting: Podiatry

## 2024-04-18 ENCOUNTER — Ambulatory Visit (INDEPENDENT_AMBULATORY_CARE_PROVIDER_SITE_OTHER)

## 2024-04-18 DIAGNOSIS — L97512 Non-pressure chronic ulcer of other part of right foot with fat layer exposed: Secondary | ICD-10-CM | POA: Diagnosis not present

## 2024-04-18 NOTE — Progress Notes (Signed)
   Chief Complaint  Patient presents with   Routine Post Op    POV #1 DOS 04/12/24 RT GREAT TOE JONES TENOSUSPENSION W/ARTHRODESIS, RT 5TH MET HEAD RESECTION.  Pain level has decreased a lot. Has been wearing boot since procedure.  Diabetic A1c 5.4.  NO anti coags    Subjective:  Patient presents today status post fifth metatarsal head resection right.  Joshua tenosuspension with IPJ arthrodesis right great toe.  DOS: 04/12/2024.  Doing well.  WBAT in the cam boot as instructed.  No new complaints  Past Medical History:  Diagnosis Date   Asthma    Diabetes (HCC)    Eczema    Food allergy    Glaucoma     Past Surgical History:  Procedure Laterality Date   CHOLECYSTECTOMY  01/2023   GLAUCOMA SURGERY     TOE AMPUTATION Left 05/2023   TOE SURGERY Left 04/2018   TOE SURGERY Left    TYMPANOSTOMY TUBE PLACEMENT Bilateral 2020   eyes    Allergies  Allergen Reactions   Ciprofloxacin Hives and Shortness Of Breath   Doxycycline  Shortness Of Breath and Itching   Erythromycin Shortness Of Breath and Rash   Shellfish Allergy Shortness Of Breath and Rash   Sulfa Antibiotics Shortness Of Breath, Rash and Anaphylaxis    Objective/Physical Exam Neurovascular status intact.  Incision well coapted with sutures and staples intact. No sign of infectious process noted. No dehiscence. No active bleeding noted.  Moderate edema noted to the surgical extremity.  Radiographic Exam RT foot 04/18/2024:  Orthopedic hardware and arthrodesis site appears to be healing routinely.  Absence of the fifth metatarsal head noted with clean osteotomy  Assessment: 1. s/p fifth met head resection right.  Joshua tenosuspension with IPJ arthrodesis right. DOS: 04/12/2024   Plan of Care:  -Patient was evaluated. X-rays reviewed - Dressings changed.  Leave clean dry and intact x 1 week -Continue minimal WBAT CAM boot -Return to clinic 1 week for staple and suture removal   Thresa EMERSON Sar, DPM [facility]  Dr. Thresa EMERSON Sar, DPM    [address] [place], [place] [address]                Office [phone]  Fax [phone]

## 2024-04-25 ENCOUNTER — Ambulatory Visit (INDEPENDENT_AMBULATORY_CARE_PROVIDER_SITE_OTHER): Admitting: Podiatry

## 2024-04-25 ENCOUNTER — Encounter: Payer: Self-pay | Admitting: Podiatry

## 2024-04-25 VITALS — Ht 69.4 in | Wt 212.6 lb

## 2024-04-25 DIAGNOSIS — L97512 Non-pressure chronic ulcer of other part of right foot with fat layer exposed: Secondary | ICD-10-CM

## 2024-04-25 NOTE — Progress Notes (Signed)
   Chief Complaint  Patient presents with   Routine Post Op    POV #2 DOS 04/12/24 RT GREAT TOE JONES TENOSUSPENSION W/ARTHRODESIS, RT 5TH MET HEAD RESECTION,     Subjective:  Patient presents today status post fifth metatarsal head resection right.  Joshua tenosuspension with IPJ arthrodesis right great toe.  DOS: 04/12/2024.  Doing well.  WBAT in the cam boot as instructed.  No new complaints  Past Medical History:  Diagnosis Date   Asthma    Diabetes (HCC)    Eczema    Food allergy    Glaucoma     Past Surgical History:  Procedure Laterality Date   CHOLECYSTECTOMY  01/2023   GLAUCOMA SURGERY     TOE AMPUTATION Left 05/2023   TOE SURGERY Left 04/2018   TOE SURGERY Left    TYMPANOSTOMY TUBE PLACEMENT Bilateral 2020   eyes    Allergies  Allergen Reactions   Ciprofloxacin Hives and Shortness Of Breath   Doxycycline  Shortness Of Breath and Itching   Erythromycin Shortness Of Breath and Rash   Shellfish Allergy Shortness Of Breath and Rash   Sulfa Antibiotics Shortness Of Breath, Rash and Anaphylaxis    Objective/Physical Exam Neurovascular status intact.  Incision well coapted with sutures and staples intact. No sign of infectious process noted. No dehiscence. No active bleeding noted.  No edema noted to the surgical extremity.  Radiographic Exam RT foot 04/18/2024:  Orthopedic hardware and arthrodesis site appears to be healing routinely.  Absence of the fifth metatarsal head noted with clean osteotomy  Assessment: 1. s/p fifth met head resection right.  Joshua tenosuspension with IPJ arthrodesis right. DOS: 04/12/2024   Plan of Care:  -Patient was evaluated. -Sutures and staples removed -Recommend Silvadene cream over the incisions daily with a light nonadherent dressing and Ace wrap over the next few weeks -Surgical shoe dispensed.  Minimal WBAT.  Discontinue cam boot -Return to clinic 2 weeks follow-up x-ray   Thresa EMERSON Sar, DPM [facility]  Dr.  Thresa EMERSON Sar, DPM    [address] [place], [place] [address]                Office [phone]  Fax [phone]

## 2024-04-30 HISTORY — PX: FOOT SURGERY: SHX648

## 2024-05-09 ENCOUNTER — Ambulatory Visit (INDEPENDENT_AMBULATORY_CARE_PROVIDER_SITE_OTHER)

## 2024-05-09 ENCOUNTER — Ambulatory Visit (INDEPENDENT_AMBULATORY_CARE_PROVIDER_SITE_OTHER): Admitting: Podiatry

## 2024-05-09 ENCOUNTER — Encounter: Payer: Self-pay | Admitting: Podiatry

## 2024-05-09 VITALS — Ht 69.0 in | Wt 212.6 lb

## 2024-05-09 DIAGNOSIS — L97512 Non-pressure chronic ulcer of other part of right foot with fat layer exposed: Secondary | ICD-10-CM

## 2024-05-09 NOTE — Progress Notes (Signed)
   Chief Complaint  Patient presents with   Routine Post Op    POV #3 DOS 04/12/24 RT GREAT TOE JONES TENOSUSPENSION W/ARTHRODESIS, RT 5TH MET HEAD RESECTION, pt states everything is going well, has no complaints.    Subjective:  Patient presents today status post fifth metatarsal head resection right.  Joshua tenosuspension with IPJ arthrodesis right great toe.  DOS: 04/12/2024.  Doing well.  WBAT in the cam boot as instructed.  No new complaints  Past Medical History:  Diagnosis Date   Asthma    Diabetes (HCC)    Eczema    Food allergy    Glaucoma     Past Surgical History:  Procedure Laterality Date   CHOLECYSTECTOMY  01/2023   GLAUCOMA SURGERY     TOE AMPUTATION Left 05/2023   TOE SURGERY Left 04/2018   TOE SURGERY Left    TYMPANOSTOMY TUBE PLACEMENT Bilateral 2020   eyes    Allergies  Allergen Reactions   Ciprofloxacin Hives and Shortness Of Breath   Doxycycline  Shortness Of Breath and Itching   Erythromycin Shortness Of Breath and Rash   Shellfish Allergy Shortness Of Breath and Rash   Sulfa Antibiotics Shortness Of Breath, Rash and Anaphylaxis    Objective/Physical Exam Neurovascular status intact.  Incisions are nicely healed.  Good range of motion of the first MTP.  The toes in rectus alignment.  No open wounds noted.  Radiographic Exam RT foot 04/18/2024:  Stable.  Orthopedic hardware and arthrodesis site appears to be healing routinely.  Absence of the fifth metatarsal head noted.  There is some irregularity of the osteotomy site to the fifth metatarsal however clinically there is no indication of infection.  I do suspect this is osseous remodeling and less likely  infection or an osteomyelitis  Assessment: 1. s/p fifth met head resection right.  Joshua tenosuspension with IPJ arthrodesis right. DOS: 04/12/2024   Plan of Care:  -Patient was evaluated. - Patient continues to do well.  The incisions are all nicely healed.  The great toe is in rectus alignment.   There is no callus or wound to the plantar aspect of the 1st or 5th MTP -There are some slight irregularity around the osteotomy site of the fifth metatarsal however there is no clinical indication of infection.  Will simply observe for now.  I do suspect this is likely bone remodeling -Slowly transition out of the postop shoe into good supportive tennis shoes and sneakers -Return to clinic 6 weeks follow-up x-ray   Thresa EMERSON Sar, DPM [facility]  Dr. Thresa EMERSON Sar, DPM    [address] [place], [place] [address]                Office [phone]  Fax [phone]

## 2024-05-11 ENCOUNTER — Other Ambulatory Visit: Payer: Self-pay | Admitting: Allergy and Immunology

## 2024-05-11 DIAGNOSIS — L209 Atopic dermatitis, unspecified: Secondary | ICD-10-CM

## 2024-05-16 ENCOUNTER — Other Ambulatory Visit: Payer: Self-pay

## 2024-05-16 MED ORDER — RYALTRIS 665-25 MCG/ACT NA SUSP: NASAL | 5 refills | Status: AC

## 2024-05-16 MED ORDER — QVAR REDIHALER 80 MCG/ACT IN AERB: INHALATION_SPRAY | RESPIRATORY_TRACT | 0 refills | Status: DC

## 2024-05-31 ENCOUNTER — Ambulatory Visit: Admitting: Allergy and Immunology

## 2024-05-31 ENCOUNTER — Encounter: Payer: Self-pay | Admitting: Allergy and Immunology

## 2024-05-31 ENCOUNTER — Telehealth: Payer: Self-pay | Admitting: *Deleted

## 2024-05-31 VITALS — BP 124/90 | HR 92 | Resp 16 | Ht 69.0 in | Wt 203.0 lb

## 2024-05-31 DIAGNOSIS — J4530 Mild persistent asthma, uncomplicated: Secondary | ICD-10-CM | POA: Diagnosis not present

## 2024-05-31 DIAGNOSIS — J3089 Other allergic rhinitis: Secondary | ICD-10-CM

## 2024-05-31 DIAGNOSIS — T7800XD Anaphylactic reaction due to unspecified food, subsequent encounter: Secondary | ICD-10-CM

## 2024-05-31 DIAGNOSIS — T7800XA Anaphylactic reaction due to unspecified food, initial encounter: Secondary | ICD-10-CM

## 2024-05-31 DIAGNOSIS — L2089 Other atopic dermatitis: Secondary | ICD-10-CM

## 2024-05-31 DIAGNOSIS — J302 Other seasonal allergic rhinitis: Secondary | ICD-10-CM

## 2024-05-31 DIAGNOSIS — L989 Disorder of the skin and subcutaneous tissue, unspecified: Secondary | ICD-10-CM

## 2024-05-31 MED ORDER — HYDROXYZINE HCL 25 MG PO TABS: 25.0000 mg | ORAL_TABLET | Freq: Two times a day (BID) | ORAL | 2 refills | Status: DC

## 2024-05-31 NOTE — Progress Notes (Signed)
 [place] [facility] [place] - [place] - [place] - [place]   Follow-up Note  Referring Provider: Stanton Lynwood FALCON, MD Primary Provider: Stanton Lynwood FALCON, MD Date of Office Visit: 05/31/2024  Subjective:   Megan Lynn (DOB: [birth year]) is a 53 y.o. female who [facility] in re-evaluation of the following:  HPI: Megan Lynn returns to this clinic in evaluation of severe atopic dermatitis, asthma, allergic rhinitis, history of shellfish allergy.  I last saw her in this clinic 24 November 2023.  We had to change her atopic dermatitis biologic.  She is currently on lebrikizumab and this appears to be working pretty well.  When she takes an injection she gets a pretty good response across her body for about 7 to 10 days and then she redevelops her dermatitis for which she will take another injection in 2 weeks.  She will use the mometasone  to her body and Opzelura  around her eyes.  She believes her nose is doing well while intermittently using a combination nasal steroid nasal antihistamine.  She believes her asthma is doing relatively well.  She has not required a systemic steroid to treat exacerbation.  Occasionally she will use Qvar  for a few days if she has some increased asthma activity.  She does not eat shellfish.  For the past year she has been developing recurrent redness and swelling of the skin around her mandible and her right ear.  She recently had a CT scan of that area and apparently it did not identify any abnormality.  Allergies as of 05/31/2024       Reactions   Ciprofloxacin Hives, Shortness Of Breath   Doxycycline  Shortness Of Breath, Itching   Erythromycin Shortness Of Breath, Rash   Shellfish Allergy Shortness Of Breath, Rash   Sulfa Antibiotics Shortness Of Breath, Rash, Anaphylaxis        Medication List    Airsupra  90-80 MCG/ACT Aero Generic drug: Albuterol -Budesonide INHALE 2 PUFFS INTO LUNGS EVERY 6 HOURS  AS NEEDED FOR COUGH , WHEEZING AND FOR SHORTNESS OF BREATH .  RINSE  GARGLE  AND  SPIT  AFTER  USE   Auvi-Q  0.3 MG/0.3ML Soaj injection Generic drug: EPINEPHrine  Use as directed for life-threatening allergic reaction.   cetirizine 10 MG tablet Commonly known as: ZYRTEC Take 10 mg by mouth daily.   cyclobenzaprine  10 MG tablet Commonly known as: FLEXERIL  Take 10 mg by mouth 3 (three) times daily as needed.   Ebglyss  250 MG/2ML Sosy Generic drug: Lebrikizumab-lbkz  Inject 250 mg into the skin every 28 (twenty-eight) days.   Ebglyss  250 MG/2ML Sosy Generic drug: Lebrikizumab-lbkz  RX#2 INJECT 250MG  (1 PREFILLED SYRINGE) SUBCUTANEOUSLY AT WEEK 4, 6, 8, 10, 12, 14   fluconazole  150 MG tablet Commonly known as: DIFLUCAN  TAKE ONE TABLET BY MOUTH AS A ONE-TIME DOSE   fluticasone  50 MCG/ACT nasal spray Commonly known as: FLONASE  Place 1 spray into both nostrils 2 (two) times daily.   gentamicin  cream 0.1 % Commonly known as: GARAMYCIN  APPLY  CREAM TOPICALLY TO AFFECTED AREA TWICE DAILY   hydrOXYzine  25 MG tablet Commonly known as: ATARAX  Take 1 tablet by mouth twice daily   ibuprofen  800 MG tablet Commonly known as: ADVIL  Take 1 tablet (800 mg total) by mouth 3 (three) times daily.   mometasone  0.1 % cream Commonly known as: ELOCON  APPLY CREAM TOPICALLY ONCE OR TWICE DAILY   Mounjaro 15 MG/0.5ML Pen Generic drug: tirzepatide SMARTSIG:15 Milligram(s) SUB-Q Once a Week   Opzelura   1.5 % Crea Generic drug: Ruxolitinib Phosphate  CAN APPLY AROUND EYES ONE TO TWO TIMES DAILY IF NEEDED   Qvar  RediHaler 80 MCG/ACT inhaler Generic drug: beclomethasone Inhale three puffs three times daily during asthma flare-up.  Rinse, gargle, and spit after use.   Ryaltris  665-25 MCG/ACT Susp Generic drug: Olopatadine-Mometasone  2 Sprays in each nostril 1-2 times per day   tacrolimus  0.1 % ointment Commonly known as: PROTOPIC  Apply ointment  1-2 times per day    Past Medical History:   Diagnosis Date   Asthma    Diabetes (HCC)    Eczema    Food allergy    Glaucoma     Past Surgical History:  Procedure Laterality Date   CHOLECYSTECTOMY  01/2023   FOOT SURGERY Right 04/2024   GLAUCOMA SURGERY     TOE AMPUTATION Left 05/2023   TOE SURGERY Left 04/2018   TOE SURGERY Left    TYMPANOSTOMY TUBE PLACEMENT Bilateral 2020   eyes    Review of systems negative except as noted in HPI / PMHx or noted below:  Review of Systems  Constitutional: Negative.   HENT: Negative.    Eyes: Negative.   Respiratory: Negative.    Cardiovascular: Negative.   Gastrointestinal: Negative.   Genitourinary: Negative.   Musculoskeletal: Negative.   Skin: Negative.   Neurological: Negative.   Endo/Heme/Allergies: Negative.   Psychiatric/Behavioral: Negative.       Objective:   Vitals:   05/31/24 1134 05/31/24 1213  BP: (!) 132/92 (!) 124/90  Pulse: 92   Resp: 16   SpO2: 98%    Height: 5' 9 (175.3 cm)  Weight: 203 lb (92.1 kg)   Physical Exam Constitutional:      Appearance: She is not diaphoretic.  HENT:     Head: Normocephalic.     Right Ear: Tympanic membrane, ear canal and external ear normal.     Left Ear: Tympanic membrane, ear canal and external ear normal.     Nose: Nose normal. No mucosal edema or rhinorrhea.     Mouth/Throat:     Pharynx: Uvula midline. No oropharyngeal exudate.  Eyes:     Conjunctiva/sclera: Conjunctivae normal.  Neck:     Thyroid: No thyromegaly.     Trachea: Trachea normal. No tracheal tenderness or tracheal deviation.  Cardiovascular:     Rate and Rhythm: Normal rate and regular rhythm.     Heart sounds: Normal heart sounds, S1 normal and S2 normal. No murmur heard. Pulmonary:     Effort: No respiratory distress.     Breath sounds: Normal breath sounds. No stridor. No wheezing or rales.  Lymphadenopathy:     Head:     Right side of head: No tonsillar adenopathy.     Left side of head: No tonsillar adenopathy.     Cervical: No  cervical adenopathy.  Skin:    Findings: Rash (induration and erythema of right mandibular area and right external ear) present. No erythema.     Nails: There is no clubbing.  Neurological:     Mental Status: She is alert.     Diagnostics: Spirometry was performed and demonstrated an FEV1 of 2.33 at 74 % of predicted.  Assessment and Plan:   1. Other atopic dermatitis   2. Asthma, well controlled, mild persistent   3. Seasonal and perennial allergic rhinitis   4. Allergy with anaphylaxis due to food   5. Inflammatory dermatosis    1.  Lebrikizumab injections every 2 weeks  2.  If needed:  A.  Mometasone  0.1% cream - 1-2 times per day  B.  Opzulera - apply around eyes 1-2 times per day  C.  Hydroxyzine  25 mg 2 times per day  D.  Ryaltris  - 2 sprays each nostril 1-2 time per day  E.  Auvi-Q  0.3  F.  Airsupra  - 2 inhalations every 6 hours  3. Can add Qvar  40 - 2 inhalations 2 times per day during increased asthma activity  4. Blood - ANA w/r  5. Return to clinic in 6 month or earlier if problem.   6. Influenza = Tamiflu. Covid = Paxlovid  We will have Megan Lynn use lebrikizumab every 2 weeks.  I do not think that we can stretch out to every 4-week administration for she developed significant dermatitis during her second week nadir.  If this is ineffective we can go back to try a oral Jak inhibitor with the understanding that when we used oral Jak inhibitors in the past we had some problems with infection.  We may need to give her an oral Jak inhibitor just 3 times per week.  She has this inflammatory dermatosis affecting her right ear and her right mandible and this may be just a stubborn area of atopic dermatitis but we will check an ANA to see if she is having any abnormality suggesting possible recurrent polychondritis.  I will contact her with the results of the blood test once is available for review.   Camellia Denis, MD Allergy / Immunology [facility]

## 2024-05-31 NOTE — Telephone Encounter (Signed)
 Prior shara has been sent for Qvar  Redihaler 80 mcg through cover my meds. Request denied, the unmet criteria is she has to try Arnuity Ellipta .

## 2024-05-31 NOTE — Patient Instructions (Addendum)
  1.  Lebrikizumab injections every 2 weeks  2.  If needed:   A.  Mometasone  0.1% cream - 1-2 times per day  B.  Opzulera - apply around eyes 1-2 times per day  C.  Hydroxyzine  25 mg 2 times per day  D.  Ryaltris  - 2 sprays each nostril 1-2 time per day  E.  Auvi-Q  0.3  F.  Airsupra  - 2 inhalations every 6 hours  3. Can add Qvar  40 - 2 inhalations 2 times per day during increased asthma activity  4. Blood - ANA w/r  5. Return to clinic in 6 month or earlier if problem.   6. Influenza = Tamiflu. Covid = Paxlovid

## 2024-06-01 NOTE — Telephone Encounter (Signed)
 Tried calling pt to let her know about the change. Need to know which pharmacy to send to?

## 2024-06-03 ENCOUNTER — Other Ambulatory Visit: Payer: Self-pay | Admitting: Allergy and Immunology

## 2024-06-03 DIAGNOSIS — L209 Atopic dermatitis, unspecified: Secondary | ICD-10-CM

## 2024-06-04 ENCOUNTER — Encounter: Payer: Self-pay | Admitting: Allergy and Immunology

## 2024-06-04 MED ORDER — ARNUITY ELLIPTA 100 MCG/ACT IN AEPB: 1.0000 | INHALATION_SPRAY | Freq: Every day | RESPIRATORY_TRACT | 5 refills | Status: AC | PRN

## 2024-06-13 ENCOUNTER — Other Ambulatory Visit: Payer: Self-pay | Admitting: Allergy and Immunology

## 2024-06-17 ENCOUNTER — Encounter: Payer: Self-pay | Admitting: Allergy and Immunology

## 2024-06-17 ENCOUNTER — Other Ambulatory Visit: Payer: Self-pay | Admitting: Allergy and Immunology

## 2024-06-19 ENCOUNTER — Telehealth: Payer: Self-pay | Admitting: *Deleted

## 2024-06-19 MED ORDER — RINVOQ 15 MG PO TB24: 15.0000 mg | ORAL_TABLET | Freq: Every day | ORAL | 11 refills | Status: AC

## 2024-06-19 NOTE — Telephone Encounter (Signed)
 Call to patient to advise approval and submit for Rinvoq  to [facility]

## 2024-06-19 NOTE — Telephone Encounter (Signed)
 Called patient and advised approval for Rinvoq  and submit to [facility] pharmacy

## 2024-06-20 ENCOUNTER — Encounter: Payer: Self-pay | Admitting: Podiatry

## 2024-06-20 ENCOUNTER — Ambulatory Visit: Admitting: Podiatry

## 2024-06-20 ENCOUNTER — Ambulatory Visit (INDEPENDENT_AMBULATORY_CARE_PROVIDER_SITE_OTHER)

## 2024-06-20 VITALS — Ht 69.0 in | Wt 203.0 lb

## 2024-06-20 DIAGNOSIS — Z01818 Encounter for other preprocedural examination: Secondary | ICD-10-CM | POA: Diagnosis not present

## 2024-06-20 DIAGNOSIS — M2041 Other hammer toe(s) (acquired), right foot: Secondary | ICD-10-CM

## 2024-06-20 DIAGNOSIS — L97512 Non-pressure chronic ulcer of other part of right foot with fat layer exposed: Secondary | ICD-10-CM

## 2024-06-20 NOTE — Progress Notes (Signed)
 Chief Complaint  Patient presents with   Routine Post Op    POV #4 DOS 04/12/24 RT GREAT TOE JONES TENOSUSPENSION W/ARTHRODESIS, RT 5TH MET HEAD RESECTION, she states everything is going well with the surgery, has a new spot on the 2nd toe, its red and swollen.    Subjective:  Patient presents today status post fifth metatarsal head resection right.  Joshua tenosuspension with IPJ arthrodesis right great toe.  DOS: 04/12/2024.  Currently the patient is doing very well in regards to the surgical site however she has developed an ulcer to the distal tip of the right second toe with contracture.  She is concerned that this will become chronic and lead to amputation.  Past Medical History:  Diagnosis Date   Asthma    Diabetes (HCC)    Eczema    Food allergy    Glaucoma     Past Surgical History:  Procedure Laterality Date   CHOLECYSTECTOMY  01/2023   FOOT SURGERY Right 04/2024   GLAUCOMA SURGERY     TOE AMPUTATION Left 05/2023   TOE SURGERY Left 04/2018   TOE SURGERY Left    TYMPANOSTOMY TUBE PLACEMENT Bilateral 2020   eyes    Allergies  Allergen Reactions   Ciprofloxacin Hives and Shortness Of Breath   Doxycycline  Shortness Of Breath and Itching   Erythromycin Shortness Of Breath and Rash   Shellfish Allergy Shortness Of Breath and Rash   Sulfa Antibiotics Shortness Of Breath, Rash and Anaphylaxis       Objective/Physical Exam Dermatology: Skin is cool, dry and supple bilateral lower extremities.  New onset of ulcer to the distal tip of the right second toe with superficial breakdown of the skin overlying the PIPJ and DIPJ of the digit as well secondary to hammertoe contracture.  Please see above noted photo  Vascular: Palpable pedal pulses bilaterally. No edema or erythema noted. Capillary refill within normal limits.  Neurological: Light touch and protective threshold diminished bilaterally  Musculoskeletal Exam: Rectus alignment of the great toe.  All incisions are  nicely healed.  Contracture of the right second digit hammertoe noted at the level of the PIPJ and DIPJ  Radiographic Exam RT foot 06/20/2024:  Stable.  Orthopedic hardware and arthrodesis site appears to be healing routinely.  Absence of the fifth metatarsal head noted.  There is some irregularity of the osteotomy site to the fifth metatarsal however clinically there is no indication of infection.  I do suspect this is osseous remodeling and less likely  infection or an osteomyelitis  Assessment: 1. s/p fifth met head resection right.  Joshua tenosuspension with IPJ arthrodesis right. DOS: 04/12/2024 2.  Hammertoe contracture second digit right foot with diabetic ulcer to the distal tip of the right second toe  Plan of Care:  -Patient was evaluated. - Unfortunately the patient developed significant contracture and hammertoe deformity to the right second toe.  She is very concerned for possible progression of the wound leading to amputation.  I do believe that early intervention to correct for the hammertoe and shortening the toe is appropriate to prevent chronic ulceration development which can lead to amputation.  This was discussed with the patient in length in detail.  No guarantees were expressed or implied.  The patient agrees and would like to proceed with surgery -Authorization for surgery was initiated today.  Surgery will consist of hammertoe arthroplasty with MTP capsulotomy and second digit right.   -Return to clinic 1 week postop  Thresa EMERSON Sar,  DPM [facility]  Dr. Thresa EMERSON Sar, DPM    [address] [place], [place] [address]                Office [phone]  Fax [phone]

## 2024-06-26 ENCOUNTER — Other Ambulatory Visit: Payer: Self-pay | Admitting: Podiatry

## 2024-06-26 ENCOUNTER — Encounter: Payer: Self-pay | Admitting: Allergy and Immunology

## 2024-06-26 ENCOUNTER — Telehealth: Payer: Self-pay | Admitting: Podiatry

## 2024-06-26 NOTE — Telephone Encounter (Signed)
 Patient contacted office to schedule surgery and is set for 07/05/2024 per her request. Patient is not on blood thinners but is on GLP1- she has been advised to not take her next dose. Last dose reported by patient was 06/24/2024. Patients preferred pharmacy is set in chart [facility] on [address].

## 2024-06-26 NOTE — Telephone Encounter (Signed)
 DOS- 07/05/2024  2ND HAMMERTOE REPAIR RT- 71714  [employer] EFFECTIVE DATE- 08/30/2021  DEDUCTIBLE- N/A OOP- $4000 REMAINING- $3063.60 COINSURANCE- 0%  PER [place] PORTAL, PRIOR AUTH FOR CPT CODE 71714 HAS BEEN APPROVED FROM 07/05/2024-10/05/2024. AUTH# [id number]

## 2024-07-05 ENCOUNTER — Other Ambulatory Visit: Payer: Self-pay | Admitting: Podiatry

## 2024-07-05 DIAGNOSIS — M2041 Other hammer toe(s) (acquired), right foot: Secondary | ICD-10-CM | POA: Diagnosis not present

## 2024-07-05 MED ORDER — IBUPROFEN 800 MG PO TABS: 800.0000 mg | ORAL_TABLET | Freq: Three times a day (TID) | ORAL | 1 refills | Status: AC

## 2024-07-05 MED ORDER — OXYCODONE-ACETAMINOPHEN 5-325 MG PO TABS: 1.0000 | ORAL_TABLET | ORAL | 0 refills | Status: DC | PRN

## 2024-07-05 MED ORDER — FLUCONAZOLE 150 MG PO TABS: 150.0000 mg | ORAL_TABLET | Freq: Once | ORAL | 0 refills | Status: DC

## 2024-07-05 MED ORDER — AMOXICILLIN-POT CLAVULANATE 875-125 MG PO TABS: 1.0000 | ORAL_TABLET | Freq: Two times a day (BID) | ORAL | 0 refills | Status: DC

## 2024-07-05 NOTE — Progress Notes (Signed)
 PRN postop

## 2024-07-08 ENCOUNTER — Other Ambulatory Visit: Payer: Self-pay | Admitting: Allergy and Immunology

## 2024-07-11 ENCOUNTER — Ambulatory Visit (INDEPENDENT_AMBULATORY_CARE_PROVIDER_SITE_OTHER): Admitting: Podiatry

## 2024-07-11 ENCOUNTER — Ambulatory Visit (INDEPENDENT_AMBULATORY_CARE_PROVIDER_SITE_OTHER)

## 2024-07-11 VITALS — BP 121/82 | HR 82

## 2024-07-11 DIAGNOSIS — M2041 Other hammer toe(s) (acquired), right foot: Secondary | ICD-10-CM | POA: Diagnosis not present

## 2024-07-11 DIAGNOSIS — Z9889 Other specified postprocedural states: Secondary | ICD-10-CM

## 2024-07-11 MED ORDER — AMOXICILLIN-POT CLAVULANATE 875-125 MG PO TABS: 1.0000 | ORAL_TABLET | Freq: Two times a day (BID) | ORAL | 0 refills | Status: DC

## 2024-07-11 NOTE — Progress Notes (Signed)
 Patient presents for post-op visit today, POV #1 DOS 07/05/2024 RT 2ND HAMMERTOE REPAIR .  RN Notes: Patient states pain has been minimal, only took pain meds for the first few days, now only taking Ibuprofen .  Vital Signs: BP-121/82 P-82    Radiographs: [x]  Taken []  Not taken  Surgical Site Assessment:  - Dressing:  [x]  Minimal dry blood, intact []  Reinforced   []  Changed     -RN Notes:   - Incision:  [x]  CDI (clean, dry, intact)  []  Mild erythema  []  Drainage noted   -RN Notes:   - Swelling:  []  None  [x]  Mild  []  Moderate   []  Significant     -RN Notes:   - Bruising:  [x]  None  []  Present:    - Sutures/Staples:  []  None [x]  Intact  []  Removed Today  []  Plan to remove at next visit   -Cast/Splint/Pins: []  None [x]  Intact []  Removed Today []  Plan to remove at next visit []  Replaced  -Signs of infection:  [x]  None  []  Present - Describe:   -DME:    []  None [x]  AFW []  Surgical shoe []  Cast  []  Splint  -Walking status:  [x]  Full WB  []  Partial WB  []  NWB  -Utilizing device:  [x]  None []  Knee Scooter []  Crutches []  Wheelchair    DVT assessment:  [x]  Denies symptoms []  Chest pain/SOB []  Pain in calf/redness/warmth   Redressed DSD and ace wrap. Educated on signs of infection, proper dressing care, pain management, and weight bearing status. Patient will contact provider with any new or worsening symptoms. The provider assessed the patient today and reviewed instructions regarding plan of care.

## 2024-07-11 NOTE — Progress Notes (Signed)
   No chief complaint on file.   Subjective:  Patient presents today status post hammertoe repair with percutaneous pin fixation right second toe.  DOS: 07/05/2024.  Doing well.  WBAT CAM boot  Past Medical History:  Diagnosis Date   Asthma    Diabetes (HCC)    Eczema    Food allergy    Glaucoma     Past Surgical History:  Procedure Laterality Date   CHOLECYSTECTOMY  01/2023   FOOT SURGERY Right 04/2024   GLAUCOMA SURGERY     TOE AMPUTATION Left 05/2023   TOE SURGERY Left 04/2018   TOE SURGERY Left    TYMPANOSTOMY TUBE PLACEMENT Bilateral 2020   eyes    Allergies  Allergen Reactions   Ciprofloxacin Hives and Shortness Of Breath   Doxycycline  Shortness Of Breath and Itching   Erythromycin Shortness Of Breath and Rash   Shellfish Allergy Shortness Of Breath and Rash   Sulfa Antibiotics Shortness Of Breath, Rash and Anaphylaxis    Objective/Physical Exam Neurovascular status intact.  Incision well coapted with staples intact. No sign of infectious process noted. No dehiscence. No active bleeding noted.  Toes in rectus alignment  Radiographic Exam RT foot 07/11/2024:  Good alignment of the second toe with percutaneous fixation pin intact.  No complicating features  Assessment: 1. s/p hammertoe repair second digit right foot. DOS: 07/05/2024   Plan of Care:  -Patient was evaluated. X-rays reviewed - Patient states that the cam boot is uncomfortable.  Okay to wear a postop shoe.  Postop shoe dispensed -Patient also requesting to have prescription for antibiotic on hand in case she does experience infection.  Refill prescription for Augmentin  875/125 mg PRN -Okay to wash and shower and get the foot wet -Return to clinic 2 weeks staple removal   Thresa EMERSON Sar, DPM [facility]  Dr. Thresa EMERSON Sar, DPM    [address] [place], [place] [address]                Office [phone]  Fax [phone]

## 2024-07-25 ENCOUNTER — Ambulatory Visit (INDEPENDENT_AMBULATORY_CARE_PROVIDER_SITE_OTHER)

## 2024-07-25 ENCOUNTER — Ambulatory Visit

## 2024-07-25 ENCOUNTER — Ambulatory Visit (INDEPENDENT_AMBULATORY_CARE_PROVIDER_SITE_OTHER): Admitting: Podiatry

## 2024-07-25 DIAGNOSIS — M2041 Other hammer toe(s) (acquired), right foot: Secondary | ICD-10-CM

## 2024-07-25 NOTE — Progress Notes (Signed)
 Patient presents for post-op visit today, POV #2 DOS 07/05/2024 RT 2ND HAMMERTOE REPAIR  Swells a little every now and then, got a shoe last visit and it hurt her foot a lot. I had an old one at home and it feels a lot better..  RN Notes: n.a  Vital Signs: Today's Vitals   07/25/24 0936  PainSc: 0-No pain      Radiographs: [x]  Taken []  Not taken  Surgical Site Assessment:  - Dressing:  [x]  Minimal dry blood, intact []  Reinforced   []  Changed     -RN Notes: n/a  - Incision:  [x]  CDI (clean, dry, intact)  []  Mild erythema  []  Drainage noted   -RN Notes: n/a  - Swelling:  []  None  [x]  Mild  []  Moderate   []  Significant     -RN Notes: n/a  - Bruising:  [x]  None  []  Present: n/a   - Sutures/Staples:  []  None [x]  Intact  [x]  Removed Today  []  Plan to remove at next visit   -Cast/Splint/Pins: []  None [x]  Intact []  Removed Today []  Plan to remove at next visit []  Replaced    -RN Notes: Provider removed pin today.  -Signs of infection:  [x]  None  []  Present - Describe: n/a  -DME:    []  None []  AFW [x]  Surgical shoe []  Cast  []  Splint  -Walking status:  [x]  Full WB  []  Partial WB  []  NWB  -Utilizing device:  [x]  None []  Knee Scooter []  Crutches []  Wheelchair    DVT assessment:  [x]  Denies symptoms []  Chest pain/SOB []  Pain in calf/redness/warmth   Redressed DSD and ace wrap. Educated on signs of infection, proper dressing care, pain management, and weight bearing status. Patient will contact provider with any new or worsening symptoms. The provider assessed the patient today and reviewed instructions regarding plan of care.

## 2024-07-27 ENCOUNTER — Other Ambulatory Visit: Payer: Self-pay | Admitting: Allergy and Immunology

## 2024-08-05 NOTE — Progress Notes (Signed)
   Chief Complaint  Patient presents with   Post-op Follow-up    POV #2 DOS 07/05/2024 RT 2ND HAMMERTOE REPAIR  Swells a little every now and then, got a shoe last visit and it hurt her foot a lot. I had an old one at home and it feels a lot better.    Subjective:  Patient presents today status post hammertoe repair with percutaneous pin fixation right second toe.  DOS: 07/05/2024.  Doing well.  WBAT CAM boot  Past Medical History:  Diagnosis Date   Asthma    Diabetes (HCC)    Eczema    Food allergy    Glaucoma     Past Surgical History:  Procedure Laterality Date   CHOLECYSTECTOMY  01/2023   FOOT SURGERY Right 04/2024   GLAUCOMA SURGERY     TOE AMPUTATION Left 05/2023   TOE SURGERY Left 04/2018   TOE SURGERY Left    TYMPANOSTOMY TUBE PLACEMENT Bilateral 2020   eyes    Allergies  Allergen Reactions   Ciprofloxacin Hives and Shortness Of Breath   Doxycycline  Shortness Of Breath and Itching   Erythromycin Shortness Of Breath and Rash   Shellfish Allergy Shortness Of Breath and Rash   Sulfa Antibiotics Shortness Of Breath, Rash and Anaphylaxis    Objective/Physical Exam Neurovascular status intact.  Incision well coapted with staples intact. No sign of infectious process noted. No dehiscence. No active bleeding noted.  Toes in rectus alignment  Radiographic Exam RT foot 07/11/2024:  Good alignment of the second toe with percutaneous fixation pin intact.  No complicating features  Assessment: 1. s/p hammertoe repair second digit right foot. DOS: 07/05/2024   Plan of Care:  -Patient was evaluated. -Staples removed -Continue WBAT postop shoe -Okay to transition out of the postop shoe into good supportive tennis shoes and sneakers in about 2 weeks. -Return to clinic 6 weeks   Thresa EMERSON Sar, DPM [facility]  Dr. Thresa EMERSON Sar, DPM    [address] [place], [place] [address]                Office [phone]   Fax [phone]

## 2024-08-08 ENCOUNTER — Encounter: Admitting: Podiatry

## 2024-08-22 ENCOUNTER — Other Ambulatory Visit: Payer: Self-pay | Admitting: Allergy and Immunology

## 2024-08-24 ENCOUNTER — Other Ambulatory Visit: Payer: Self-pay | Admitting: Podiatry

## 2024-08-24 ENCOUNTER — Other Ambulatory Visit: Payer: Self-pay | Admitting: Allergy and Immunology

## 2024-09-05 ENCOUNTER — Ambulatory Visit (INDEPENDENT_AMBULATORY_CARE_PROVIDER_SITE_OTHER): Admitting: Podiatry

## 2024-09-05 ENCOUNTER — Ambulatory Visit (INDEPENDENT_AMBULATORY_CARE_PROVIDER_SITE_OTHER)

## 2024-09-05 ENCOUNTER — Other Ambulatory Visit: Payer: Self-pay | Admitting: Podiatry

## 2024-09-05 ENCOUNTER — Encounter: Payer: Self-pay | Admitting: Podiatry

## 2024-09-05 VITALS — Ht 69.0 in | Wt 203.0 lb

## 2024-09-05 DIAGNOSIS — M2041 Other hammer toe(s) (acquired), right foot: Secondary | ICD-10-CM

## 2024-09-05 NOTE — Patient Instructions (Signed)

## 2024-09-05 NOTE — Progress Notes (Addendum)
" ° °  Chief Complaint  Patient presents with   Routine Post Op    POV #3 DOS 07/05/2024 RT 2ND HAMMERTOE REPAIR,     Subjective:  Patient presents today status post hammertoe repair with percutaneous pin fixation right second toe.  DOS: 07/05/2024.  Doing well.  No new complaints  Past Medical History:  Diagnosis Date   Asthma    Diabetes (HCC)    Eczema    Food allergy    Glaucoma     Past Surgical History:  Procedure Laterality Date   CHOLECYSTECTOMY  01/2023   FOOT SURGERY Right 04/2024   GLAUCOMA SURGERY     TOE AMPUTATION Left 05/2023   TOE SURGERY Left 04/2018   TOE SURGERY Left    TYMPANOSTOMY TUBE PLACEMENT Bilateral 2020   eyes    Allergies  Allergen Reactions   Ciprofloxacin Hives and Shortness Of Breath   Doxycycline  Shortness Of Breath and Itching   Erythromycin Shortness Of Breath and Rash   Shellfish Allergy Shortness Of Breath and Rash   Sulfa Antibiotics Shortness Of Breath, Rash and Anaphylaxis    B/L feet 09/05/2024  Objective/Physical Exam Neurovascular status intact.  Incision nicely healed.  Toes in rectus alignment.  No pain or tenderness noted with palpation or range of motion.  No edema.  Overall well-healing surgical foot  Radiographic Exam RT foot 09/05/2024:  Broken percutaneous fixation pin noted embedded within the second metatarsal without any complication.  There is some malalignment of the phalanges of the second toe.  Orthopedic screw to the great toe is stable and intact.  History of fifth metatarsal head resection without any complicating features noted radiographically  Assessment: 1. s/p hammertoe repair second digit right foot. DOS: 07/05/2024   Plan of Care:  -Patient was evaluated.  X-rays reviewed - Okay to continue good supportive shoes and sneakers -Full activity no restrictions -Return to clinic PRN  Thresa EMERSON Sar, DPM [facility]  Dr. Thresa EMERSON Sar, DPM    [address] [place], [place] [address]                Office [phone]  Fax [phone]       "

## 2024-09-06 LAB — ANA W/REFLEX IF POSITIVE: Anti Nuclear Antibody (ANA): NEGATIVE

## 2024-09-07 ENCOUNTER — Telehealth: Payer: Self-pay

## 2024-09-07 ENCOUNTER — Other Ambulatory Visit: Payer: Self-pay | Admitting: Podiatry

## 2024-09-07 MED ORDER — AMOXICILLIN-POT CLAVULANATE 875-125 MG PO TABS: 1.0000 | ORAL_TABLET | Freq: Two times a day (BID) | ORAL | 0 refills | Status: AC

## 2024-09-07 NOTE — Telephone Encounter (Signed)
 Patient called she is waiting for antibiotic to be sent into the pharmacy.

## 2024-09-12 ENCOUNTER — Ambulatory Visit: Payer: Self-pay | Admitting: Allergy and Immunology

## 2024-09-13 ENCOUNTER — Telehealth: Payer: Self-pay | Admitting: Podiatry

## 2024-09-13 NOTE — Telephone Encounter (Signed)
 Called and patient is scheduled for surgery on 10/11/2024. Patient is on GLP1 and has been advised to d/c use the week prior to surgery. Patient not on blood thinners. Patient pharmacy correct in chart.

## 2024-09-15 ENCOUNTER — Other Ambulatory Visit: Payer: Self-pay | Admitting: Allergy and Immunology

## 2024-09-25 ENCOUNTER — Telehealth: Payer: Self-pay | Admitting: Podiatry

## 2024-09-25 NOTE — Telephone Encounter (Signed)
 DOS- 10/11/2024  METATARSAL HEAD RES 5TH LT- 28113 3RD + 4TH HAMMERTOE REPAIR LT- [phone] ARTHRODESIS WITH EXTENSOR HALLUCIS LONGUS TRANSFER TO FIRST METATARSAL NECK, GREAT TOE, INTERPHALANGEAL JOINT LT- [phone]  [facility] EFFECTIVE DATE- 08/30/2024  DEDUCTIBLE- $0 REMAINING- $0 OOP- $6300 REMAINING- $6285 COINSURANCE- 0%  PER [employer] PORTAL, PRIOR AUTH IS NOT REQUIRED FOR CPT CODES 71886, [id number], AND [id number].

## 2024-10-17 ENCOUNTER — Encounter: Admitting: Podiatry

## 2024-10-31 ENCOUNTER — Encounter: Admitting: Podiatry

## 2024-11-14 ENCOUNTER — Encounter: Admitting: Podiatry

## 2024-11-29 ENCOUNTER — Ambulatory Visit: Admitting: Allergy and Immunology
